# Patient Record
Sex: Male | Born: 1980 | Race: White | Hispanic: No | Smoking: Current every day smoker
Health system: Southern US, Community
[De-identification: ages and names within clinical notes are randomized; demographics above are authoritative.]

## PROBLEM LIST (undated history)

## (undated) DIAGNOSIS — F1910 Other psychoactive substance abuse, uncomplicated: Secondary | ICD-10-CM

## (undated) DIAGNOSIS — F410 Panic disorder [episodic paroxysmal anxiety] without agoraphobia: Secondary | ICD-10-CM

---
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
## 2002-06-15 ENCOUNTER — Emergency Department (HOSPITAL_COMMUNITY): Admission: EM | Admit: 2002-06-15 | Discharge: 2002-06-15 | Payer: Self-pay | Admitting: *Deleted

## 2002-10-10 ENCOUNTER — Emergency Department (HOSPITAL_COMMUNITY): Admission: EM | Admit: 2002-10-10 | Discharge: 2002-10-10 | Payer: Self-pay | Admitting: Emergency Medicine

## 2008-02-28 ENCOUNTER — Emergency Department (HOSPITAL_COMMUNITY): Admission: EM | Admit: 2008-02-28 | Discharge: 2008-02-28 | Payer: Self-pay | Admitting: *Deleted

## 2011-08-04 ENCOUNTER — Emergency Department (HOSPITAL_COMMUNITY): Payer: No Typology Code available for payment source

## 2011-08-04 ENCOUNTER — Encounter (HOSPITAL_COMMUNITY): Payer: Self-pay

## 2011-08-04 ENCOUNTER — Emergency Department (HOSPITAL_COMMUNITY)
Admission: EM | Admit: 2011-08-04 | Discharge: 2011-08-04 | Disposition: A | Discharge status: Home / Self Care | Payer: No Typology Code available for payment source | Attending: Emergency Medicine | Admitting: Emergency Medicine

## 2011-08-04 DIAGNOSIS — F17200 Nicotine dependence, unspecified, uncomplicated: Secondary | ICD-10-CM | POA: Insufficient documentation

## 2011-08-04 DIAGNOSIS — M5380 Other specified dorsopathies, site unspecified: Secondary | ICD-10-CM | POA: Insufficient documentation

## 2011-08-04 DIAGNOSIS — M545 Low back pain, unspecified: Secondary | ICD-10-CM | POA: Insufficient documentation

## 2011-08-04 DIAGNOSIS — M542 Cervicalgia: Secondary | ICD-10-CM | POA: Insufficient documentation

## 2011-08-04 MED ORDER — HYDROCODONE-ACETAMINOPHEN 5-325 MG PO TABS
1.0000 | ORAL_TABLET | Freq: Once | ORAL | Status: AC
  Administered 2011-08-04: 1 via ORAL
  Filled 2011-08-04: qty 1

## 2011-08-04 MED ORDER — IBUPROFEN 800 MG PO TABS: 800.0000 mg | ORAL_TABLET | Freq: Three times a day (TID) | ORAL | Status: AC

## 2011-08-04 MED ORDER — DIAZEPAM 5 MG PO TABS: 5.0000 mg | ORAL_TABLET | Freq: Two times a day (BID) | ORAL | Status: AC
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
## 2011-08-05 NOTE — ED Provider Notes (Signed)
Medical screening examination/treatment/procedure(s) were performed by non-physician practitioner and as supervising physician I was immediately available for consultation/collaboration.   Shiasia Porro, MD 08/05/11 2255 

## 2012-09-09 ENCOUNTER — Encounter (HOSPITAL_COMMUNITY): Payer: Self-pay | Admitting: Emergency Medicine

## 2012-09-09 ENCOUNTER — Emergency Department (HOSPITAL_COMMUNITY)
Admission: EM | Admit: 2012-09-09 | Discharge: 2012-09-09 | Disposition: A | Discharge status: Home / Self Care | Payer: Self-pay | Attending: Emergency Medicine | Admitting: Emergency Medicine

## 2012-09-09 DIAGNOSIS — L03119 Cellulitis of unspecified part of limb: Secondary | ICD-10-CM | POA: Insufficient documentation

## 2012-09-09 DIAGNOSIS — L02419 Cutaneous abscess of limb, unspecified: Secondary | ICD-10-CM

## 2012-09-09 DIAGNOSIS — F17200 Nicotine dependence, unspecified, uncomplicated: Secondary | ICD-10-CM | POA: Insufficient documentation

## 2012-09-09 DIAGNOSIS — L03116 Cellulitis of left lower limb: Secondary | ICD-10-CM

## 2012-09-09 MED ORDER — HYDROCODONE-ACETAMINOPHEN 5-325 MG PO TABS: 1.0000 | ORAL_TABLET | Freq: Four times a day (QID) | ORAL | Status: DC | PRN

## 2012-09-09 MED ORDER — SULFAMETHOXAZOLE-TRIMETHOPRIM 800-160 MG PO TABS: 1.0000 | ORAL_TABLET | Freq: Two times a day (BID) | ORAL | Status: DC

## 2012-09-09 MED ORDER — LIDOCAINE-EPINEPHRINE 2 %-1:100000 IJ SOLN
10.0000 mL | Freq: Once | INTRAMUSCULAR | Status: AC
  Administered 2012-09-09: 10 mL via INTRADERMAL

## 2012-09-09 MED ORDER — IBUPROFEN 600 MG PO TABS: 600.0000 mg | ORAL_TABLET | Freq: Four times a day (QID) | ORAL | Status: DC | PRN

## 2012-09-09 MED ORDER — CEPHALEXIN 500 MG PO CAPS: 500.0000 mg | ORAL_CAPSULE | Freq: Two times a day (BID) | ORAL | Status: DC

## 2012-09-09 NOTE — ED Provider Notes (Signed)
History     CSN: [id number]  Arrival date & time 09/09/12  1114   First MD Initiated Contact with Patient 09/09/12 1150      Chief Complaint  Patient presents with  . Abscess    2 day hx of increased red, swollen raised area on anterior lower leg.  . Wound Infection    Cellulitus - 2 inch diameter, redness around abscess    (Consider location/radiation/quality/duration/timing/severity/associated sxs/prior treatment) HPI Aldan Camey Bosque is a 32 y.o. male who presents with complaint of swelling and redness to the left lower leg. No drainage No injuries to the leg. States started out as a pimple and now swollen, redness, tenderness. Denies fever, chills, malaise. No treatment tried. No other complaints. No hx of the same.    History reviewed. No pertinent past medical history.  History reviewed. No pertinent past surgical history.  Family History  Problem Relation Age of Onset  . Cancer Other     History  Substance Use Topics  . Smoking status: Current Every Day Smoker    Types: Cigarettes  . Smokeless tobacco: Not on file  . Alcohol Use: No      Review of Systems  Constitutional: Negative for fever and chills.  Skin: Positive for color change and wound.    Allergies  Review of patient's allergies indicates no known allergies.  Home Medications  No current outpatient prescriptions on file.  BP 140/78  Pulse 76  Temp(Src) 98.1 F (36.7 C) (Oral)  Resp 18  SpO2 100%  Physical Exam  Nursing note and vitals reviewed. Constitutional: He appears well-developed and well-nourished. No distress.  Eyes: Conjunctivae are normal.  Cardiovascular: Normal rate, regular rhythm and normal heart sounds.   Pulmonary/Chest: Effort normal and breath sounds normal. No respiratory distress. He has no wheezes. He has no rales.  Skin: Skin is warm and dry.  3x3cm erythematous, indurated abscess with fluctuance.no drainage. There is about 5cm diameter surrounding erythema,  tenderness. No streaking. No compartment syndrome. Dorsal pedal pulses normal.     ED Course  Procedures (including critical care time)  INCISION AND DRAINAGE Performed by: Jaynie Crumble A Consent: Verbal consent obtained. Risks and benefits: risks, benefits and alternatives were discussed Type: abscess  Body area: left anterior mid shin  Anesthesia: local infiltration  Incision was made with a scalpel.  Local anesthetic: lidocaine 2% w epinephrine  Anesthetic total: 2 ml  Complexity: complex Blunt dissection to break up loculations  Drainage: purulent  Drainage amount: moderate  Patient tolerance: Patient tolerated the procedure well with no immediate complications.     1. Abscess of lower leg   2. Cellulitis of left lower leg       MDM  Pt with left anterior shin abscess and surrounding cellulitis. I&Ded. Pt tolerated it well. Will start on antibiotic. Follow up in 48hrs. Pt otherwise non toxic. Afebrile.   Filed Vitals:   09/09/12 1122 09/09/12 1306  BP: 140/78 129/88  Pulse: 76   Temp: 98.1 F (36.7 C)   TempSrc: Oral   Resp: 18   SpO2: 100%            Arrie Borrelli A Minervia Osso, PA-C 09/09/12 1555

## 2012-09-09 NOTE — ED Notes (Signed)
Pt reports increased pain and tenderness around red raised area on l/lower leg. "possible insect bite"

## 2012-09-11 NOTE — ED Provider Notes (Signed)
Medical screening examination/treatment/procedure(s) were performed by non-physician practitioner and as supervising physician I was immediately available for consultation/collaboration.   Laray Anger, DO 09/11/12 1919
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
## 2013-01-20 ENCOUNTER — Emergency Department (HOSPITAL_COMMUNITY)
Admission: EM | Admit: 2013-01-20 | Discharge: 2013-01-20 | Disposition: A | Discharge status: Home / Self Care | Payer: Self-pay | Attending: Emergency Medicine | Admitting: Emergency Medicine

## 2013-01-20 ENCOUNTER — Encounter (HOSPITAL_COMMUNITY): Payer: Self-pay | Admitting: Emergency Medicine

## 2013-01-20 DIAGNOSIS — F17200 Nicotine dependence, unspecified, uncomplicated: Secondary | ICD-10-CM | POA: Insufficient documentation

## 2013-01-20 DIAGNOSIS — L02219 Cutaneous abscess of trunk, unspecified: Secondary | ICD-10-CM | POA: Insufficient documentation

## 2013-01-20 DIAGNOSIS — L0291 Cutaneous abscess, unspecified: Secondary | ICD-10-CM

## 2013-01-20 MED ORDER — HYDROCODONE-ACETAMINOPHEN 5-325 MG PO TABS: ORAL_TABLET | ORAL | Status: DC

## 2013-01-20 MED ORDER — SULFAMETHOXAZOLE-TMP DS 800-160 MG PO TABS
1.0000 | ORAL_TABLET | Freq: Once | ORAL | Status: AC
  Administered 2013-01-20: 1 via ORAL
  Filled 2013-01-20: qty 1

## 2013-01-20 MED ORDER — SULFAMETHOXAZOLE-TRIMETHOPRIM 800-160 MG PO TABS: 1.0000 | ORAL_TABLET | Freq: Two times a day (BID) | ORAL | Status: DC

## 2013-01-20 MED ORDER — HYDROCODONE-ACETAMINOPHEN 5-325 MG PO TABS
2.0000 | ORAL_TABLET | Freq: Once | ORAL | Status: AC
  Administered 2013-01-20: 2 via ORAL
  Filled 2013-01-20: qty 2

## 2013-01-20 NOTE — ED Provider Notes (Signed)
CSN: [id number]     Arrival date & time 01/20/13  1354 History     First MD Initiated Contact with Patient 01/20/13 1519     Chief Complaint  Patient presents with  . Recurrent Skin Infections    left chest   (Consider location/radiation/quality/duration/timing/severity/associated sxs/prior Treatment) HPI  Joshua Bishop is a 32 y.o. male complaining of abscess to left chest worsening over the course of 5 days. Patient has prior history of single abscess to the left leg. Patient tried opening suggest abscess with no success. He denies fever, nausea vomiting states is painful, described as pressure-like 8/10, exacerbated by palpation  History reviewed. No pertinent past medical history. History reviewed. No pertinent past surgical history. Family History  Problem Relation Age of Onset  . Cancer Other    History  Substance Use Topics  . Smoking status: Current Every Day Smoker    Types: Cigarettes  . Smokeless tobacco: Not on file  . Alcohol Use: No    Review of Systems  10 systems reviewed and found to be negative, except as noted in the HPI   Allergies  Review of patient's allergies indicates no known allergies.  Home Medications   Current Outpatient Rx  Name  Route  Sig  Dispense  Refill  . acetaminophen (TYLENOL) 500 MG tablet   Oral   Take 1,000 mg by mouth every 6 (six) hours as needed for pain.          BP 132/82  Pulse 72  Temp(Src) 98.4 F (36.9 C) (Oral)  Resp 18  SpO2 96% Physical Exam  Nursing note and vitals reviewed. Constitutional: He is oriented to person, place, and time. He appears well-developed and well-nourished. No distress.  HENT:  Head: Normocephalic.  Mouth/Throat: Oropharynx is clear and moist.  Eyes: Conjunctivae and EOM are normal. Pupils are equal, round, and reactive to light.  Cardiovascular: Normal rate.   Pulmonary/Chest: Effort normal and breath sounds normal. No stridor.    Abdominal: Soft. Bowel sounds are normal.    Musculoskeletal: Normal range of motion.  Neurological: He is alert and oriented to person, place, and time.  Skin:  Fluctuant abscess to left chest surrounded by a 8 cm area of cellulitis  Psychiatric: He has a normal mood and affect.    ED Course   Procedures (including critical care time) INCISION AND DRAINAGE Performed by: Wynetta Emery Consent: Verbal consent obtained. Risks and benefits: risks, benefits and alternatives were discussed Type: abscess  Body area: Left chest  Anesthesia: local infiltration  Incision was made with a scalpel.  Local anesthetic: lidocaine 2% with epinephrine  Anesthetic total: 3 ml  Complexity: complex Blunt dissection to break up loculations  Drainage: purulent  Drainage amount: 2mL  Packing material: none  Patient tolerance: Patient tolerated the procedure well with no immediate complications.     Labs Reviewed - No data to display No results found. 1. Abscess and cellulitis     MDM   Filed Vitals:   01/20/13 1428  BP: 132/82  Pulse: 72  Temp: 98.4 F (36.9 C)  TempSrc: Oral  Resp: 18  SpO2: 96%     Joshua Bishop is a 32 y.o. male with abscess and cellulitis to left chest, incision and drainage performed. No signs of cellulitis. Patient will be started on Bactrim. Return precautions discussed.  Medications  HYDROcodone-acetaminophen (NORCO/VICODIN) 5-325 MG per tablet 2 tablet (2 tablets Oral Given 01/20/13 1619)  sulfamethoxazole-trimethoprim (BACTRIM DS) 800-160 MG per tablet 1  tablet (1 tablet Oral Given 01/20/13 1619)    Pt is hemodynamically stable, appropriate for, and amenable to discharge at this time. Pt verbalized understanding and agrees with care plan. All questions answered. Outpatient follow-up and specific return precautions discussed.    Discharge Medication List as of 01/20/2013  4:14 PM    START taking these medications   Details  HYDROcodone-acetaminophen (NORCO/VICODIN) 5-325 MG per  tablet Take 1-2 tablets by mouth every 6 hours as needed for pain., Print    sulfamethoxazole-trimethoprim (SEPTRA DS) 800-160 MG per tablet Take 1 tablet by mouth every 12 (twelve) hours., Starting 01/20/2013, Until Discontinued, Print        Note: Portions of this report may have been transcribed using voice recognition software. Every effort was made to ensure accuracy; however, inadvertent computerized transcription errors may be present    Wynetta Emery, PA-C 01/22/13 [phone]

## 2013-01-20 NOTE — ED Notes (Signed)
Pt states that he has a boil on his left chest that has been there about five days.  Pt states that he tried popping it bc it was green.  Pt states that it's getting bigger and more sore.

## 2013-01-20 NOTE — Progress Notes (Signed)
[facility] provided patient with a [employer].

## 2013-01-30 NOTE — ED Provider Notes (Signed)
Medical screening examination/treatment/procedure(s) were performed by non-physician practitioner and as supervising physician I was immediately available for consultation/collaboration.   Loren Racer, MD 01/30/13 [phone]

## 2013-08-02 ENCOUNTER — Encounter (HOSPITAL_COMMUNITY): Payer: Self-pay | Admitting: Emergency Medicine

## 2013-08-02 ENCOUNTER — Emergency Department (HOSPITAL_COMMUNITY)
Admission: EM | Admit: 2013-08-02 | Discharge: 2013-08-02 | Disposition: A | Discharge status: Home / Self Care | Payer: Self-pay | Attending: Emergency Medicine | Admitting: Emergency Medicine

## 2013-08-02 ENCOUNTER — Emergency Department (HOSPITAL_COMMUNITY): Payer: Self-pay

## 2013-08-02 DIAGNOSIS — J3489 Other specified disorders of nose and nasal sinuses: Secondary | ICD-10-CM | POA: Insufficient documentation

## 2013-08-02 DIAGNOSIS — Y939 Activity, unspecified: Secondary | ICD-10-CM | POA: Insufficient documentation

## 2013-08-02 DIAGNOSIS — F17200 Nicotine dependence, unspecified, uncomplicated: Secondary | ICD-10-CM | POA: Insufficient documentation

## 2013-08-02 DIAGNOSIS — Y929 Unspecified place or not applicable: Secondary | ICD-10-CM | POA: Insufficient documentation

## 2013-08-02 DIAGNOSIS — W010XXA Fall on same level from slipping, tripping and stumbling without subsequent striking against object, initial encounter: Secondary | ICD-10-CM | POA: Insufficient documentation

## 2013-08-02 DIAGNOSIS — S93409A Sprain of unspecified ligament of unspecified ankle, initial encounter: Secondary | ICD-10-CM | POA: Insufficient documentation

## 2013-08-02 MED ORDER — NAPROXEN 500 MG PO TABS: 500.0000 mg | ORAL_TABLET | Freq: Two times a day (BID) | ORAL | Status: DC

## 2013-08-02 NOTE — ED Notes (Signed)
Pt states that he slipped off his steps and now his L foot is swollen and bruised. No hx of injury to this foot.

## 2013-08-02 NOTE — ED Notes (Signed)
Pt in XRAY 

## 2013-08-02 NOTE — ED Notes (Signed)
Ortho called 

## 2013-08-02 NOTE — Discharge Instructions (Signed)
Your caregiver has diagnosed you as suffering from an ankle sprain. Ankle sprain occurs when the ligaments that hold the ankle joint together are stretched or torn. It may take 4 to 6 weeks to heal. For Activity: Use crutches with non-weight bearing for the first few days. Then, you may walk on your ankle as the pain allows, or as instructed. Start gradually with weight bearing on the affected ankle. Once you can walk pain free, then try jogging. When you can run forwards, then you can try moving side-to-side. If you cannot walk without crutches in one week, you need a re-check. SEEK IMMEDIATE MEDICAL ATTENTION IF: your toes are numb or tingling, appear gray or blue, or you have severe pain (also elevate leg and loosen splint).   Ankle Sprain An ankle sprain is an injury to the strong, fibrous tissues (ligaments) that hold the bones of your ankle joint together.  CAUSES An ankle sprain is usually caused by a fall or by twisting your ankle. Ankle sprains most commonly occur when you step on the outer edge of your foot, and your ankle turns inward. People who participate in sports are more prone to these types of injuries.  SYMPTOMS   Pain in your ankle. The pain may be present at rest or only when you are trying to stand or walk.  Swelling.  Bruising. Bruising may develop immediately or within 1 to 2 days after your injury.  Difficulty standing or walking, particularly when turning corners or changing directions. DIAGNOSIS  Your caregiver will ask you details about your injury and perform a physical exam of your ankle to determine if you have an ankle sprain. During the physical exam, your caregiver will press on and apply pressure to specific areas of your foot and ankle. Your caregiver will try to move your ankle in certain ways. An X-ray exam may be done to be sure a bone was not broken or a ligament did not separate from one of the bones in your ankle (avulsion fracture).  TREATMENT  Certain  types of braces can help stabilize your ankle. Your caregiver can make a recommendation for this. Your caregiver may recommend the use of medicine for pain. If your sprain is severe, your caregiver may refer you to a surgeon who helps to restore function to parts of your skeletal system (orthopedist) or a physical therapist. HOME CARE INSTRUCTIONS   Apply ice to your injury for 1 2 days or as directed by your caregiver. Applying ice helps to reduce inflammation and pain.  Put ice in a plastic bag.  Place a towel between your skin and the bag.  Leave the ice on for 15-20 minutes at a time, every 2 hours while you are awake.  Only take over-the-counter or prescription medicines for pain, discomfort, or fever as directed by your caregiver.  Elevate your injured ankle above the level of your heart as much as possible for 2 3 days.  If your caregiver recommends crutches, use them as instructed. Gradually put weight on the affected ankle. Continue to use crutches or a cane until you can walk without feeling pain in your ankle.  If you have a plaster splint, wear the splint as directed by your caregiver. Do not rest it on anything harder than a pillow for the first 24 hours. Do not put weight on it. Do not get it wet. You may take it off to take a shower or bath.  You may have been given an elastic bandage  to wear around your ankle to provide support. If the elastic bandage is too tight (you have numbness or tingling in your foot or your foot becomes cold and blue), adjust the bandage to make it comfortable.  If you have an air splint, you may blow more air into it or let air out to make it more comfortable. You may take your splint off at night and before taking a shower or bath. Wiggle your toes in the splint several times per day to decrease swelling. SEEK MEDICAL CARE IF:   You have rapidly increasing bruising or swelling.  Your toes feel extremely cold or you lose feeling in your  foot.  Your pain is not relieved with medicine. SEEK IMMEDIATE MEDICAL CARE IF:  Your toes are numb or blue.  You have severe pain that is increasing. MAKE SURE YOU:   Understand these instructions.  Will watch your condition.  Will get help right away if you are not doing well or get worse. Document Released: 06/03/2005 Document Revised: 02/26/2012 Document Reviewed: 06/15/2011 [facility] Patient Information 2014 [place], [place].

## 2013-08-02 NOTE — ED Provider Notes (Signed)
CSN: [id number]     Arrival date & time 08/02/13  1424 History  This chart was scribed for non-physician practitioner, Katherina MiresAbigail Allyanna Appleman-PA, working with Rolland PorterMark James, MD by Smiley HousemanFallon Davis, ED Scribe. This patient was seen in room WTR5/WTR5 and the patient's care was started at 4:05 PM.    Chief Complaint  Patient presents with  . Foot Injury   The history is provided by the patient. No language interpreter was used.   HPI Comments: Joshua Bishop is a 33 y.o. male who presents to the Emergency Department complaining of a left foot injury that occurred last night when he slipped on his concrete steps and fell.  Pt states he felt a pop when he fell, but is unsure if he heard a pop.  He reports he is unsure how his foot landed.  He states he woke up this morning with pressure in his left foot and swelling.  Pt denies numbness and tingling in his toes.  Pt denies any past h/o of injury to his left foot.    History reviewed. No pertinent past medical history. History reviewed. No pertinent past surgical history. Family History  Problem Relation Age of Onset  . Cancer Other    History  Substance Use Topics  . Smoking status: Current Every Day Smoker    Types: Cigarettes  . Smokeless tobacco: Not on file  . Alcohol Use: No    Review of Systems  Constitutional: Negative for fever and chills.  HENT: Positive for rhinorrhea.   Respiratory: Negative for cough and shortness of breath.   Cardiovascular: Negative for chest pain and leg swelling.  Gastrointestinal: Negative for nausea, vomiting, abdominal pain and diarrhea.  Musculoskeletal: Negative for back pain, gait problem, neck pain and neck stiffness.       Left foot injury  Skin: Negative for color change and rash.  Neurological: Negative for syncope.  Psychiatric/Behavioral: Negative for behavioral problems and confusion.  All other systems reviewed and are negative.    Allergies  Review of patient's allergies indicates no known  allergies.  Home Medications   Current Outpatient Rx  Name  Route  Sig  Dispense  Refill  . acetaminophen (TYLENOL) 500 MG tablet   Oral   Take 1,000 mg by mouth every 6 (six) hours as needed for pain.         . naproxen (NAPROSYN) 500 MG tablet   Oral   Take 1 tablet (500 mg total) by mouth 2 (two) times daily.   30 tablet   0    Triage Vitals: BP 130/79  Pulse 69  Temp(Src) 98.1 F (36.7 C) (Oral)  Resp 18  Ht 5\' 9"  (1.753 m)  Wt 187 lb (84.823 kg)  BMI 27.60 kg/m2  SpO2 98%  Physical Exam  Nursing note and vitals reviewed. Constitutional: He is oriented to person, place, and time. He appears well-developed and well-nourished. No distress.  HENT:  Head: Normocephalic and atraumatic.  Eyes: Conjunctivae and EOM are normal. Right eye exhibits no discharge. Left eye exhibits no discharge.  Neck: Neck supple. No tracheal deviation present.  Cardiovascular: Normal rate.   Pulmonary/Chest: Effort normal. No respiratory distress.  Abdominal: Soft.  Musculoskeletal: Normal range of motion. He exhibits tenderness.  Good pulses.  Swelling across mid dorsum of left foot.  Swelling worse over the 4th and 5th metatarsal.    Neurological: He is alert and oriented to person, place, and time.  Skin: Skin is warm and dry. No rash noted.  Psychiatric: He has a normal mood and affect. His behavior is normal. Judgment and thought content normal.    ED Course  Procedures (including critical care time) DIAGNOSTIC STUDIES: Oxygen Saturation is 98% on RA, normal by my interpretation.    COORDINATION OF CARE: 4:14 PM-informed pt his x-ray showed no abnormalities.  Will order crutches and ASO ankle.  Will discharge with naprosyn.  Patient informed of current plan of treatment and evaluation and agrees with plan.    Labs Review Labs Reviewed - No data to display Imaging Review Dg Foot Complete Left  08/02/2013   CLINICAL DATA:  Fall, dorsal foot pain  EXAM: LEFT FOOT - COMPLETE 3+  VIEW  COMPARISON:  None.  FINDINGS: There is no evidence of fracture or dislocation. There is no evidence of arthropathy or other focal bone abnormality. Soft tissues are unremarkable.  IMPRESSION: No acute osseous finding.   Electronically Signed   By: Ruel Favors M.D.   On: 08/02/2013 16:03    EKG Interpretation   None       MDM   Final diagnoses:  Ankle sprain    Patient X-Ray negative for obvious fracture or dislocation. Pain managed in ED. Pt advised to follow up with orthopedics if symptoms persist for possibility of missed fracture diagnosis. Patient given brace while in ED, conservative therapy recommended and discussed. Patient will be dc home & is agreeable with above plan.  I personally performed the services described in this documentation, which was scribed in my presence. The recorded information has been reviewed and is accurate.        Arthor Captain, PA-C 08/03/13 [phone]

## 2013-08-06 NOTE — ED Provider Notes (Signed)
Medical screening examination/treatment/procedure(s) were performed by non-physician practitioner and as supervising physician I was immediately available for consultation/collaboration.  EKG Interpretation   None         Emogene Muratalla, MD 08/06/13 0822 

## 2017-10-19 ENCOUNTER — Emergency Department (HOSPITAL_COMMUNITY)
Admission: EM | Admit: 2017-10-19 | Discharge: 2017-10-19 | Disposition: A | Discharge status: Home / Self Care | Payer: Self-pay | Attending: Emergency Medicine | Admitting: Emergency Medicine

## 2017-10-19 ENCOUNTER — Other Ambulatory Visit: Payer: Self-pay

## 2017-10-19 ENCOUNTER — Emergency Department (HOSPITAL_COMMUNITY): Payer: Self-pay

## 2017-10-19 ENCOUNTER — Encounter (HOSPITAL_COMMUNITY): Payer: Self-pay | Admitting: Emergency Medicine

## 2017-10-19 DIAGNOSIS — F17210 Nicotine dependence, cigarettes, uncomplicated: Secondary | ICD-10-CM | POA: Insufficient documentation

## 2017-10-19 DIAGNOSIS — Z79899 Other long term (current) drug therapy: Secondary | ICD-10-CM | POA: Insufficient documentation

## 2017-10-19 DIAGNOSIS — J40 Bronchitis, not specified as acute or chronic: Secondary | ICD-10-CM | POA: Insufficient documentation

## 2017-10-19 LAB — URINALYSIS, ROUTINE W REFLEX MICROSCOPIC
Bilirubin Urine: NEGATIVE
GLUCOSE, UA: NEGATIVE mg/dL
Hgb urine dipstick: NEGATIVE
KETONES UR: NEGATIVE mg/dL
LEUKOCYTES UA: NEGATIVE
Nitrite: NEGATIVE
PH: 9 — AB (ref 5.0–8.0)
Protein, ur: NEGATIVE mg/dL
Specific Gravity, Urine: 1.009 (ref 1.005–1.030)

## 2017-10-19 LAB — COMPREHENSIVE METABOLIC PANEL
ALK PHOS: 52 U/L (ref 38–126)
ALT: 20 U/L (ref 17–63)
AST: 25 U/L (ref 15–41)
Albumin: 3.8 g/dL (ref 3.5–5.0)
Anion gap: 6 (ref 5–15)
BILIRUBIN TOTAL: 1.3 mg/dL — AB (ref 0.3–1.2)
BUN: 11 mg/dL (ref 6–20)
CALCIUM: 9.4 mg/dL (ref 8.9–10.3)
CHLORIDE: 106 mmol/L (ref 101–111)
CO2: 24 mmol/L (ref 22–32)
Creatinine, Ser: 0.93 mg/dL (ref 0.61–1.24)
GFR calc non Af Amer: >60 mL/min (ref >60)
Glucose, Bld: 130 mg/dL — ABNORMAL HIGH (ref 65–99)
Potassium: 3.8 mmol/L (ref 3.5–5.1)
Sodium: 136 mmol/L (ref 135–145)
Total Protein: 6.9 g/dL (ref 6.5–8.1)

## 2017-10-19 LAB — CBC
HEMATOCRIT: 40.9 % (ref 39.0–52.0)
Hemoglobin: 13.6 g/dL (ref 13.0–17.0)
MCH: 30.2 pg (ref 26.0–34.0)
MCHC: 33.3 g/dL (ref 30.0–36.0)
MCV: 90.7 fL (ref 78.0–100.0)
Platelets: 213 10*3/uL (ref 150–400)
RBC: 4.51 MIL/uL (ref 4.22–5.81)
RDW: 13.3 % (ref 11.5–15.5)
WBC: 9.5 10*3/uL (ref 4.0–10.5)

## 2017-10-19 LAB — LIPASE, BLOOD: LIPASE: 24 U/L (ref 11–51)

## 2017-10-19 MED ORDER — AMOXICILLIN 500 MG PO CAPS: 500.0000 mg | ORAL_CAPSULE | Freq: Three times a day (TID) | ORAL | 0 refills | Status: DC

## 2017-10-19 NOTE — ED Provider Notes (Signed)
[facility] EMERGENCY DEPARTMENT Provider Note   CSN: [id number] Arrival date & time: 10/19/17  1841     History   Chief Complaint Chief Complaint  Patient presents with  . Abdominal Pain    HPI Joshua Bishop is a 37 y.o. male.  Patient complains of cough congestion some abdominal cramping and aches.  The history is provided by the patient. No language interpreter was used.  Illness  This is a new problem. The current episode started 2 days ago. The problem occurs constantly. Pertinent negatives include no chest pain, no abdominal pain and no headaches. Nothing aggravates the symptoms. Nothing relieves the symptoms. He has tried nothing for the symptoms. The treatment provided no relief.    History reviewed. No pertinent past medical history.  There are no active problems to display for this patient.   History reviewed. No pertinent surgical history.      Home Medications    Prior to Admission medications   Medication Sig Start Date End Date Taking? Authorizing Provider  ibuprofen (ADVIL,MOTRIN) 200 MG tablet Take 400 mg by mouth every 6 (six) hours as needed.   Yes [provider]  acetaminophen (TYLENOL) 500 MG tablet Take 1,000 mg by mouth every 6 (six) hours as needed for pain.    [provider]  amoxicillin (AMOXIL) 500 MG capsule Take 1 capsule (500 mg total) by mouth 3 (three) times daily. 10/19/17   Bethann Berkshire, MD    Family History Family History  Problem Relation Age of Onset  . Cancer Other     Social History Social History   Tobacco Use  . Smoking status: Current Every Day Smoker    Packs/day: 0.06    Types: Cigarettes  . Smokeless tobacco: Never Used  Substance Use Topics  . Alcohol use: No  . Drug use: Never     Allergies   Patient has no known allergies.   Review of Systems Review of Systems  Constitutional: Negative for appetite change and fatigue.  HENT: Negative for congestion, ear discharge and sinus  pressure.   Eyes: Negative for discharge.  Respiratory: Positive for cough.   Cardiovascular: Negative for chest pain.  Gastrointestinal: Negative for abdominal pain and diarrhea.  Genitourinary: Negative for frequency and hematuria.  Musculoskeletal: Negative for back pain.  Skin: Negative for rash.  Neurological: Negative for seizures and headaches.  Psychiatric/Behavioral: Negative for hallucinations.     Physical Exam Updated Vital Signs BP 131/71 (BP Location: Right Arm)   Pulse 83   Temp 98.9 F (37.2 C) (Oral)   Resp 18   Ht  (1.753 m)   Wt 83.9 kg (185 lb)   SpO2 99%   BMI 27.32 kg/m   Physical Exam  Constitutional: He is oriented to person, place, and time. He appears well-developed.  HENT:  Head: Normocephalic.  Eyes: Conjunctivae and EOM are normal. No scleral icterus.  Neck: Neck supple. No thyromegaly present.  Cardiovascular: Normal rate and regular rhythm. Exam reveals no gallop and no friction rub.  No murmur heard. Pulmonary/Chest: No stridor. He has no wheezes. He has no rales. He exhibits no tenderness.  Abdominal: He exhibits no distension. There is no tenderness. There is no rebound.  Musculoskeletal: Normal range of motion. He exhibits no edema.  Lymphadenopathy:    He has no cervical adenopathy.  Neurological: He is oriented to person, place, and time. He exhibits normal muscle tone. Coordination normal.  Skin: No rash noted. No erythema.  Psychiatric: He has  a normal mood and affect. His behavior is normal.     ED Treatments / Results  Labs (all labs ordered are listed, but only abnormal results are displayed) Labs Reviewed  COMPREHENSIVE METABOLIC PANEL - Abnormal; Notable for the following components:      Result Value   Glucose, Bld 130 (*)    Total Bilirubin 1.3 (*)    All other components within normal limits  URINALYSIS, ROUTINE W REFLEX MICROSCOPIC - Abnormal; Notable for the following components:   pH 9.0 (*)    All other  components within normal limits  LIPASE, BLOOD  CBC    EKG None  Radiology Dg Chest 2 View  Result Date: 10/19/2017 CLINICAL DATA:  Cough, chest pain EXAM: CHEST - 2 VIEW COMPARISON:  None. FINDINGS: Normal cardiac silhouette. There bronchitic markings centrally. No focal infiltrate. No pneumothorax. No acute osseous abnormality. IMPRESSION: Bronchitic markings centrally suggests bronchiolitis or bronchitis. Electronically Signed   By: Genevive Bi M.D.   On: 10/19/2017 20:50    Procedures Procedures (including critical care time)  Medications Ordered in ED Medications - No data to display   Initial Impression / Assessment and Plan / ED Course  I have reviewed the triage vital signs and the nursing notes.  Pertinent labs & imaging results that were available during my care of the patient were reviewed by me and considered in my medical decision making (see chart for details).     Patient with bronchitis.  Patient will take Tylenol or Motrin,  is given some amoxicillin will drink plenty of fluids and follow-up if not improving  Final Clinical Impressions(s) / ED Diagnoses   Final diagnoses:  Bronchitis    ED Discharge Orders        Ordered    amoxicillin (AMOXIL) 500 MG capsule  3 times daily     10/19/17 2111       Bethann Berkshire, MD 10/19/17 2116

## 2017-10-19 NOTE — Discharge Instructions (Addendum)
Drink plenty of fluids. Take Tylenol or Motrin for fevers and aches and follow-up if not improving °

## 2017-10-19 NOTE — ED Notes (Signed)
Pt returned from xray

## 2017-10-19 NOTE — ED Triage Notes (Addendum)
Patient has multiple generalized abd pain with nausea, vomiting, diarrhea, and fevers x2 days. Patient also states cough, sore throat, and sinus pressure/congestion. Per patient cough productive with thick black sputum. Patient took Muinex, delsym, and ibuprofen with no relief. Patient's girlfriend states decreased appetite and fatigue.

## 2019-01-10 ENCOUNTER — Other Ambulatory Visit: Payer: Self-pay

## 2019-01-10 ENCOUNTER — Emergency Department (HOSPITAL_COMMUNITY)
Admission: EM | Admit: 2019-01-10 | Discharge: 2019-01-10 | Disposition: A | Discharge status: Home / Self Care | Payer: Self-pay | Attending: Emergency Medicine | Admitting: Emergency Medicine

## 2019-01-10 ENCOUNTER — Encounter (HOSPITAL_COMMUNITY): Payer: Self-pay | Admitting: Emergency Medicine

## 2019-01-10 DIAGNOSIS — E876 Hypokalemia: Secondary | ICD-10-CM | POA: Insufficient documentation

## 2019-01-10 DIAGNOSIS — F410 Panic disorder [episodic paroxysmal anxiety] without agoraphobia: Secondary | ICD-10-CM | POA: Insufficient documentation

## 2019-01-10 DIAGNOSIS — F17210 Nicotine dependence, cigarettes, uncomplicated: Secondary | ICD-10-CM | POA: Insufficient documentation

## 2019-01-10 LAB — COMPREHENSIVE METABOLIC PANEL
ALT: 18 U/L (ref 0–44)
AST: 22 U/L (ref 15–41)
Albumin: 4.5 g/dL (ref 3.5–5.0)
Alkaline Phosphatase: 59 U/L (ref 38–126)
Anion gap: 10 (ref 5–15)
BUN: 13 mg/dL (ref 6–20)
CO2: 19 mmol/L — ABNORMAL LOW (ref 22–32)
Calcium: 9.2 mg/dL (ref 8.9–10.3)
Chloride: 106 mmol/L (ref 98–111)
Creatinine, Ser: 1.04 mg/dL (ref 0.61–1.24)
GFR calc Af Amer: >60 mL/min (ref >60)
GFR calc non Af Amer: >60 mL/min (ref >60)
Glucose, Bld: 90 mg/dL (ref 70–99)
Potassium: 2.9 mmol/L — ABNORMAL LOW (ref 3.5–5.1)
Sodium: 135 mmol/L (ref 135–145)
Total Bilirubin: 1.9 mg/dL — ABNORMAL HIGH (ref 0.3–1.2)
Total Protein: 7.2 g/dL (ref 6.5–8.1)

## 2019-01-10 MED ORDER — SODIUM CHLORIDE 0.9 % IV BOLUS (SEPSIS)
1000.0000 mL | Freq: Once | INTRAVENOUS | Status: AC
  Administered 2019-01-10: 1000 mL via INTRAVENOUS

## 2019-01-10 MED ORDER — POTASSIUM CHLORIDE CRYS ER 20 MEQ PO TBCR
40.0000 meq | EXTENDED_RELEASE_TABLET | Freq: Once | ORAL | Status: AC
  Administered 2019-01-10: 40 meq via ORAL
  Filled 2019-01-10: qty 2

## 2019-01-10 MED ORDER — LORAZEPAM 1 MG PO TABS
1.0000 mg | ORAL_TABLET | Freq: Once | ORAL | Status: AC
  Administered 2019-01-10: 1 mg via ORAL
  Filled 2019-01-10: qty 1

## 2019-01-10 MED ORDER — POTASSIUM CHLORIDE 10 MEQ/100ML IV SOLN
10.0000 meq | Freq: Once | INTRAVENOUS | Status: AC
  Administered 2019-01-10: 10 meq via INTRAVENOUS
  Filled 2019-01-10: qty 100

## 2019-01-10 MED ORDER — SODIUM CHLORIDE 0.9 % IV SOLN
1000.0000 mL | INTRAVENOUS | Status: DC
  Administered 2019-01-10: 1000 mL via INTRAVENOUS

## 2019-01-10 NOTE — Discharge Instructions (Signed)
Your potassium was low at 2.9.  You received both IV and oral potassium while being here in the emergency department.  I am including information on the potassium content of food.  Please increase high potassium foods in your diet.  Please see your doctor or call the clinic listed on your discharge instructions to have your potassium rechecked in the next 7 to 10 days.  You may also want to discuss the issues with panic attacks.

## 2019-01-10 NOTE — ED Provider Notes (Signed)
[facility] EMERGENCY DEPARTMENT Provider Note   CSN: [id number] Arrival date & time: 01/10/19  1828     History   Chief Complaint Chief Complaint  Patient presents with  . Panic Attack    HPI Joshua Bishop is a 38 y.o. male.     Patient is a 38 year old male who presents to the emergency department with a complaint of panic attacks.  The patient states that in the last couple of hours he had to panic attacks just prior to his arrival to the emergency department.  He is also noticed that over the past couple of weeks he is having an increased number of panic attacks.  The patient would not identify stressors at this time.  He denies suicidal homicidal ideations.  He is not on any medication for his stress or panic situation.  He denies the use of any drugs or alcohol.  He presents now for evaluation because he says he is having cramping of his fingers, discomfort about his face, and sensation that he may be having a more serious problem than just the panic.     History reviewed. No pertinent past medical history.  There are no active problems to display for this patient.   History reviewed. No pertinent surgical history.      Home Medications    Prior to Admission medications   Medication Sig Start Date End Date Taking? Authorizing Provider  acetaminophen (TYLENOL) 500 MG tablet Take 1,000 mg by mouth every 6 (six) hours as needed for pain.    [provider]  amoxicillin (AMOXIL) 500 MG capsule Take 1 capsule (500 mg total) by mouth 3 (three) times daily. 10/19/17   Milton Ferguson, MD  ibuprofen (ADVIL,MOTRIN) 200 MG tablet Take 400 mg by mouth every 6 (six) hours as needed.    [provider]    Family History Family History  Problem Relation Age of Onset  . Cancer Other     Social History Social History   Tobacco Use  . Smoking status: Current Every Day Smoker    Packs/day: 0.06    Types: Cigarettes  . Smokeless tobacco: Never Used   Substance Use Topics  . Alcohol use: No  . Drug use: Never     Allergies   Patient has no known allergies.   Review of Systems Review of Systems  Constitutional: Negative for activity change and appetite change.  HENT: Negative for congestion, ear discharge, ear pain, facial swelling, nosebleeds, rhinorrhea, sneezing and tinnitus.   Eyes: Negative for photophobia, pain and discharge.  Respiratory: Negative for cough, choking, shortness of breath and wheezing.   Cardiovascular: Negative for chest pain, palpitations and leg swelling.  Gastrointestinal: Negative for abdominal pain, blood in stool, constipation, diarrhea, nausea and vomiting.  Genitourinary: Negative for difficulty urinating, dysuria, flank pain, frequency and hematuria.  Musculoskeletal: Positive for myalgias. Negative for back pain, gait problem and neck pain.  Skin: Negative for color change, rash and wound.  Neurological: Negative for dizziness, seizures, syncope, facial asymmetry, speech difficulty, weakness and numbness.  Hematological: Negative for adenopathy. Does not bruise/bleed easily.  Psychiatric/Behavioral: Negative for agitation, confusion, hallucinations, self-injury and suicidal ideas. The patient is nervous/anxious.      Physical Exam Updated Vital Signs BP (!) 130/110 (BP Location: Right Arm)   Pulse (!) 110   Temp 98.5 F (36.9 C) (Oral)   Resp (!) 22   Ht 5\' 8"  (1.727 m)   Wt 83.9 kg   SpO2 100%  BMI 28.13 kg/m   Physical Exam Vitals signs and nursing note reviewed.  Constitutional:      Appearance: He is well-developed. He is not toxic-appearing.  HENT:     Head: Normocephalic.     Right Ear: Tympanic membrane and external ear normal.     Left Ear: Tympanic membrane and external ear normal.  Eyes:     General: Lids are normal.     Pupils: Pupils are equal, round, and reactive to light.  Neck:     Musculoskeletal: Normal range of motion and neck supple.     Vascular: No carotid  bruit.  Cardiovascular:     Rate and Rhythm: Regular rhythm. Tachycardia present.     Pulses: Normal pulses.     Heart sounds: Normal heart sounds.  Pulmonary:     Effort: Tachypnea present. No respiratory distress.     Breath sounds: Normal breath sounds.  Abdominal:     General: Bowel sounds are normal.     Palpations: Abdomen is soft.     Tenderness: There is no abdominal tenderness. There is no guarding.  Musculoskeletal: Normal range of motion.  Lymphadenopathy:     Head:     Right side of head: No submandibular adenopathy.     Left side of head: No submandibular adenopathy.     Cervical: No cervical adenopathy.  Skin:    General: Skin is warm and dry.  Neurological:     Mental Status: He is alert and oriented to person, place, and time.     Cranial Nerves: No cranial nerve deficit.     Sensory: No sensory deficit.  Psychiatric:        Attention and Perception: He does not perceive auditory or visual hallucinations.        Mood and Affect: Mood is anxious.        Speech: Speech normal.        Thought Content: Thought content does not include homicidal or suicidal ideation. Thought content does not include homicidal or suicidal plan.      ED Treatments / Results  Labs (all labs ordered are listed, but only abnormal results are displayed) Labs Reviewed - No data to display  EKG None  Radiology No results found.  Procedures Procedures (including critical care time)  Medications Ordered in ED Medications - No data to display   Initial Impression / Assessment and Plan / ED Course  I have reviewed the triage vital signs and the nursing notes.  Pertinent labs & imaging results that were available during my care of the patient were reviewed by me and considered in my medical decision making (see chart for details).         Final Clinical Impressions(s) / ED Diagnoses MDM  Vital signs reviewed, pulse rate is elevated, respiratory rate is elevated, blood  pressure is elevated.  Patient is anxious.  Pulse oximetry is 100% on room air, within normal limits by my interpretation. Oral Ativan given.  Patient placed on the monitor.  Pulse oximetry and rhythm strip were monitored closely.  A comprehensive metabolic panel was obtained.  The potassium was noted to be low at 2.9, the CO2 was slightly low at 19.  Patient was given IV fluids and he was also given oral and IV potassium.  The total bilirubin was slightly elevated at 1.9, the remainder of the comprehensive metabolic panel was well within normal limits. After IV fluids, potassium, and Ativan, patient states he feels much better.  I have  asked the patient to increase his fluids.  To add Gatorade in his fluid arsenal.  Have also given him information on foods high in potassium.  And have asked him to follow-up with his primary physician to have his potassium rechecked in the office as an outpatient.  The patient is in agreement with this plan.  I have asked the patient to discuss his panic attacks with his primary physician.  The patient is to return to the emergency department if symptoms worsen, if there are any suicidal homicidal ideations, problems, or concerns.   Final diagnoses:  Panic attack  Hypokalemia    ED Discharge Orders    None       Ivery QualeBryant, Zayvien Canning, PA-C 01/12/19 1002    Terrilee FilesButler, Michael C, MD 01/12/19 [phone]

## 2019-01-10 NOTE — ED Triage Notes (Signed)
Pt states that he has had two panic attacks in the last hours.

## 2019-02-07 ENCOUNTER — Emergency Department (HOSPITAL_COMMUNITY)
Admission: EM | Admit: 2019-02-07 | Discharge: 2019-02-07 | Disposition: A | Discharge status: Home / Self Care | Payer: Self-pay | Attending: Emergency Medicine | Admitting: Emergency Medicine

## 2019-02-07 ENCOUNTER — Emergency Department (HOSPITAL_COMMUNITY): Payer: Self-pay

## 2019-02-07 ENCOUNTER — Other Ambulatory Visit: Payer: Self-pay

## 2019-02-07 DIAGNOSIS — F17210 Nicotine dependence, cigarettes, uncomplicated: Secondary | ICD-10-CM | POA: Insufficient documentation

## 2019-02-07 DIAGNOSIS — F410 Panic disorder [episodic paroxysmal anxiety] without agoraphobia: Secondary | ICD-10-CM | POA: Insufficient documentation

## 2019-02-07 LAB — CBC WITH DIFFERENTIAL/PLATELET
Abs Immature Granulocytes: 0.02 10*3/uL (ref 0.00–0.07)
Basophils Absolute: 0.1 10*3/uL (ref 0.0–0.1)
Basophils Relative: 1 %
Eosinophils Absolute: 0.3 10*3/uL (ref 0.0–0.5)
Eosinophils Relative: 4 %
HCT: 44.4 % (ref 39.0–52.0)
Hemoglobin: 15 g/dL (ref 13.0–17.0)
Immature Granulocytes: 0 %
Lymphocytes Relative: 34 %
Lymphs Abs: 2.2 10*3/uL (ref 0.7–4.0)
MCH: 30.2 pg (ref 26.0–34.0)
MCHC: 33.8 g/dL (ref 30.0–36.0)
MCV: 89.3 fL (ref 80.0–100.0)
Monocytes Absolute: 0.7 10*3/uL (ref 0.1–1.0)
Monocytes Relative: 10 %
Neutro Abs: 3.3 10*3/uL (ref 1.7–7.7)
Neutrophils Relative %: 51 %
Platelets: 255 10*3/uL (ref 150–400)
RBC: 4.97 MIL/uL (ref 4.22–5.81)
RDW: 13.2 % (ref 11.5–15.5)
WBC: 6.6 10*3/uL (ref 4.0–10.5)
nRBC: 0 % (ref 0.0–0.2)

## 2019-02-07 LAB — MAGNESIUM: Magnesium: 1.9 mg/dL (ref 1.7–2.4)

## 2019-02-07 LAB — BASIC METABOLIC PANEL
Anion gap: 9 (ref 5–15)
BUN: 8 mg/dL (ref 6–20)
CO2: 20 mmol/L — ABNORMAL LOW (ref 22–32)
Calcium: 9.1 mg/dL (ref 8.9–10.3)
Chloride: 108 mmol/L (ref 98–111)
Creatinine, Ser: 0.96 mg/dL (ref 0.61–1.24)
GFR calc Af Amer: >60 mL/min (ref >60)
GFR calc non Af Amer: >60 mL/min (ref >60)
Glucose, Bld: 101 mg/dL — ABNORMAL HIGH (ref 70–99)
Potassium: 3.9 mmol/L (ref 3.5–5.1)
Sodium: 137 mmol/L (ref 135–145)

## 2019-02-07 MED ORDER — LORAZEPAM 0.5 MG PO TABS: 0.5000 mg | ORAL_TABLET | Freq: Three times a day (TID) | ORAL | 0 refills | Status: DC | PRN

## 2019-02-07 MED ORDER — SODIUM CHLORIDE 0.9 % IV BOLUS
1000.0000 mL | Freq: Once | INTRAVENOUS | Status: AC
  Administered 2019-02-07: 13:00:00 1000 mL via INTRAVENOUS

## 2019-02-07 MED ORDER — LORAZEPAM 2 MG/ML IJ SOLN
0.5000 mg | Freq: Once | INTRAMUSCULAR | Status: AC
  Administered 2019-02-07: 13:00:00 0.5 mg via INTRAVENOUS
  Filled 2019-02-07: qty 1

## 2019-02-07 NOTE — Discharge Instructions (Addendum)
Your work-up is reassuring today.  I suspect you are having panic attacks.  It is important that you follow-up with 1 of the counselors or psychiatrists as prescribed below or someone else that you would like to follow-up with in the community.  Take 1/2 tablet or 1 tablet Ativan every 8 hours as needed for anxiety or panic attacks.  Please return to the emergency department if you develop any new or worsening symptoms.

## 2019-02-07 NOTE — ED Provider Notes (Signed)
[facility] Provider Note   CSN: [id number] Arrival date & time: 02/07/19  1252     History   Chief Complaint Chief Complaint  Patient presents with  . Abnormal Lab    HPI Joshua Bishop is a 38 y.o. male who is previously healthy who presents with shortness of breath and tingling in his hands.  He feels like he is having a panic attack.  He thought that it may be related to his potassium being low, as it was last visit.  Patient denies any chest pain, cough, fever.  He reports his neck felt tight and sore during this episode.  He felt like a could not move his hands.  He reports he has been having episodes like this for the past 6 weeks.  He reports he took 1 of his brothers Xanax (who also suffers from panic attacks) and his symptoms resolved.  Patient denies any SI, HI.     HPI  No past medical history on file.  There are no active problems to display for this patient.   No past surgical history on file.      Home Medications    Prior to Admission medications   Medication Sig Start Date End Date Taking? Authorizing Provider  acetaminophen (TYLENOL) 500 MG tablet Take 1,000 mg by mouth every 6 (six) hours as needed for pain.    [provider]  amoxicillin (AMOXIL) 500 MG capsule Take 1 capsule (500 mg total) by mouth 3 (three) times daily. 10/19/17   Milton Ferguson, MD  ibuprofen (ADVIL,MOTRIN) 200 MG tablet Take 400 mg by mouth every 6 (six) hours as needed.    [provider]  LORazepam (ATIVAN) 0.5 MG tablet Take 1 tablet (0.5 mg total) by mouth every 8 (eight) hours as needed for anxiety (or panic attacks). 02/07/19   Frederica Kuster, PA-C    Family History Family History  Problem Relation Age of Onset  . Cancer Other     Social History Social History   Tobacco Use  . Smoking status: Current Every Day Smoker    Packs/day: 0.06    Types: Cigarettes  . Smokeless tobacco: Never Used  Substance Use  Topics  . Alcohol use: No  . Drug use: Never     Allergies   Patient has no known allergies.   Review of Systems Review of Systems  Constitutional: Negative for chills and fever.  HENT: Negative for facial swelling and sore throat.   Respiratory: Positive for shortness of breath.   Cardiovascular: Negative for chest pain.  Gastrointestinal: Negative for abdominal pain, nausea and vomiting.  Genitourinary: Negative for dysuria.  Musculoskeletal: Positive for neck pain. Negative for back pain.  Skin: Negative for rash and wound.  Neurological: Positive for numbness (tingling in hands). Negative for headaches.  Psychiatric/Behavioral: The patient is not nervous/anxious.      Physical Exam Updated Vital Signs BP (!) 136/95   Pulse 70   Temp 98.2 F (36.8 C) (Oral)   Resp 18   SpO2 99%   Physical Exam Vitals signs and nursing note reviewed.  Constitutional:      General: He is not in acute distress.    Appearance: He is well-developed. He is not diaphoretic.  HENT:     Head: Normocephalic and atraumatic.     Mouth/Throat:     Pharynx: No oropharyngeal exudate.  Eyes:     General: No scleral icterus.       Right  eye: No discharge.        Left eye: No discharge.     Conjunctiva/sclera: Conjunctivae normal.     Pupils: Pupils are equal, round, and reactive to light.  Neck:     Musculoskeletal: Normal range of motion and neck supple.     Thyroid: No thyromegaly.  Cardiovascular:     Rate and Rhythm: Normal rate and regular rhythm.     Heart sounds: Normal heart sounds. No murmur. No friction rub. No gallop.   Pulmonary:     Effort: Pulmonary effort is normal. No respiratory distress.     Breath sounds: Normal breath sounds. No stridor. No wheezing or rales.  Abdominal:     General: Bowel sounds are normal. There is no distension.     Palpations: Abdomen is soft.     Tenderness: There is no abdominal tenderness. There is no guarding or rebound.  Lymphadenopathy:      Cervical: No cervical adenopathy.  Skin:    General: Skin is warm and dry.     Coloration: Skin is not pale.     Findings: No rash.  Neurological:     Mental Status: He is alert.     Coordination: Coordination normal.     Comments: CN 3-12 intact; normal sensation throughout; 5/5 strength in all 4 extremities; equal bilateral grip strength      ED Treatments / Results  Labs (all labs ordered are listed, but only abnormal results are displayed) Labs Reviewed  BASIC METABOLIC PANEL - Abnormal; Notable for the following components:      Result Value   CO2 20 (*)    Glucose, Bld 101 (*)    All other components within normal limits  CBC WITH DIFFERENTIAL/PLATELET  MAGNESIUM    EKG None  Radiology Dg Chest Portable 1 View  Result Date: 02/07/2019 CLINICAL DATA:  Shortness of breath. EXAM: PORTABLE CHEST 1 VIEW COMPARISON:  10/19/2017 FINDINGS: The heart size and mediastinal contours are within normal limits. There is no evidence of pulmonary edema, consolidation, pneumothorax, nodule or pleural fluid. The visualized skeletal structures are unremarkable. IMPRESSION: No active disease. Electronically Signed   By: Irish LackGlenn  Yamagata M.D.   On: 02/07/2019 13:43    Procedures Procedures (including critical care time)  Medications Ordered in ED Medications  sodium chloride 0.9 % bolus 1,000 mL (1,000 mLs Intravenous New Bag/Given 02/07/19 1324)  LORazepam (ATIVAN) injection 0.5 mg (0.5 mg Intravenous Given 02/07/19 1320)     Initial Impression / Assessment and Plan / ED Course  I have reviewed the triage vital signs and the nursing notes.  Pertinent labs & imaging results that were available during my care of the patient were reviewed by me and considered in my medical decision making (see chart for details).        Patient presenting with suspected panic attack, history of same.  Potassium is within normal limits today as well as magnesium.  CBC unremarkable.  Chest x-ray is  clear.  Patient is feeling much better after Ativan.  Will discharge home with very short course.  Follow-up to outpatient counseling or psychiatry.  Return precautions discussed.  Patient understands and agrees with plan.  Patient vital stable throughout ED course and discharged in satisfactory condition.  Patient also evaluated by my attending, Dr. Rosalia Hammersay, who guided the patient's management and agrees with plan  Final Clinical Impressions(s) / ED Diagnoses   Final diagnoses:  Panic attack    ED Discharge Orders  Ordered    LORazepam (ATIVAN) 0.5 MG tablet  Every 8 hours PRN     02/07/19 1447           Emi HolesLaw, Liat Mayol M, PA-C 02/07/19 1455    Margarita Grizzleay, Danielle, MD 02/07/19 [id number]

## 2019-02-07 NOTE — ED Triage Notes (Signed)
Pt states that about a couple of weeks ago he was told he had low k+ and was replaced ; pt c/o sob that ebgan 1 hr ago along with tingling all over his body and a sore neck ; pt has hx of panic attacks and states this feels the same

## 2019-04-05 IMAGING — DX DG CHEST 2V
2 series · 2 of 2 positions shown · non-contrast
Comparison: None.

CLINICAL DATA: Cough, chest pain

EXAM:
CHEST - 2 VIEW

[chest pa]
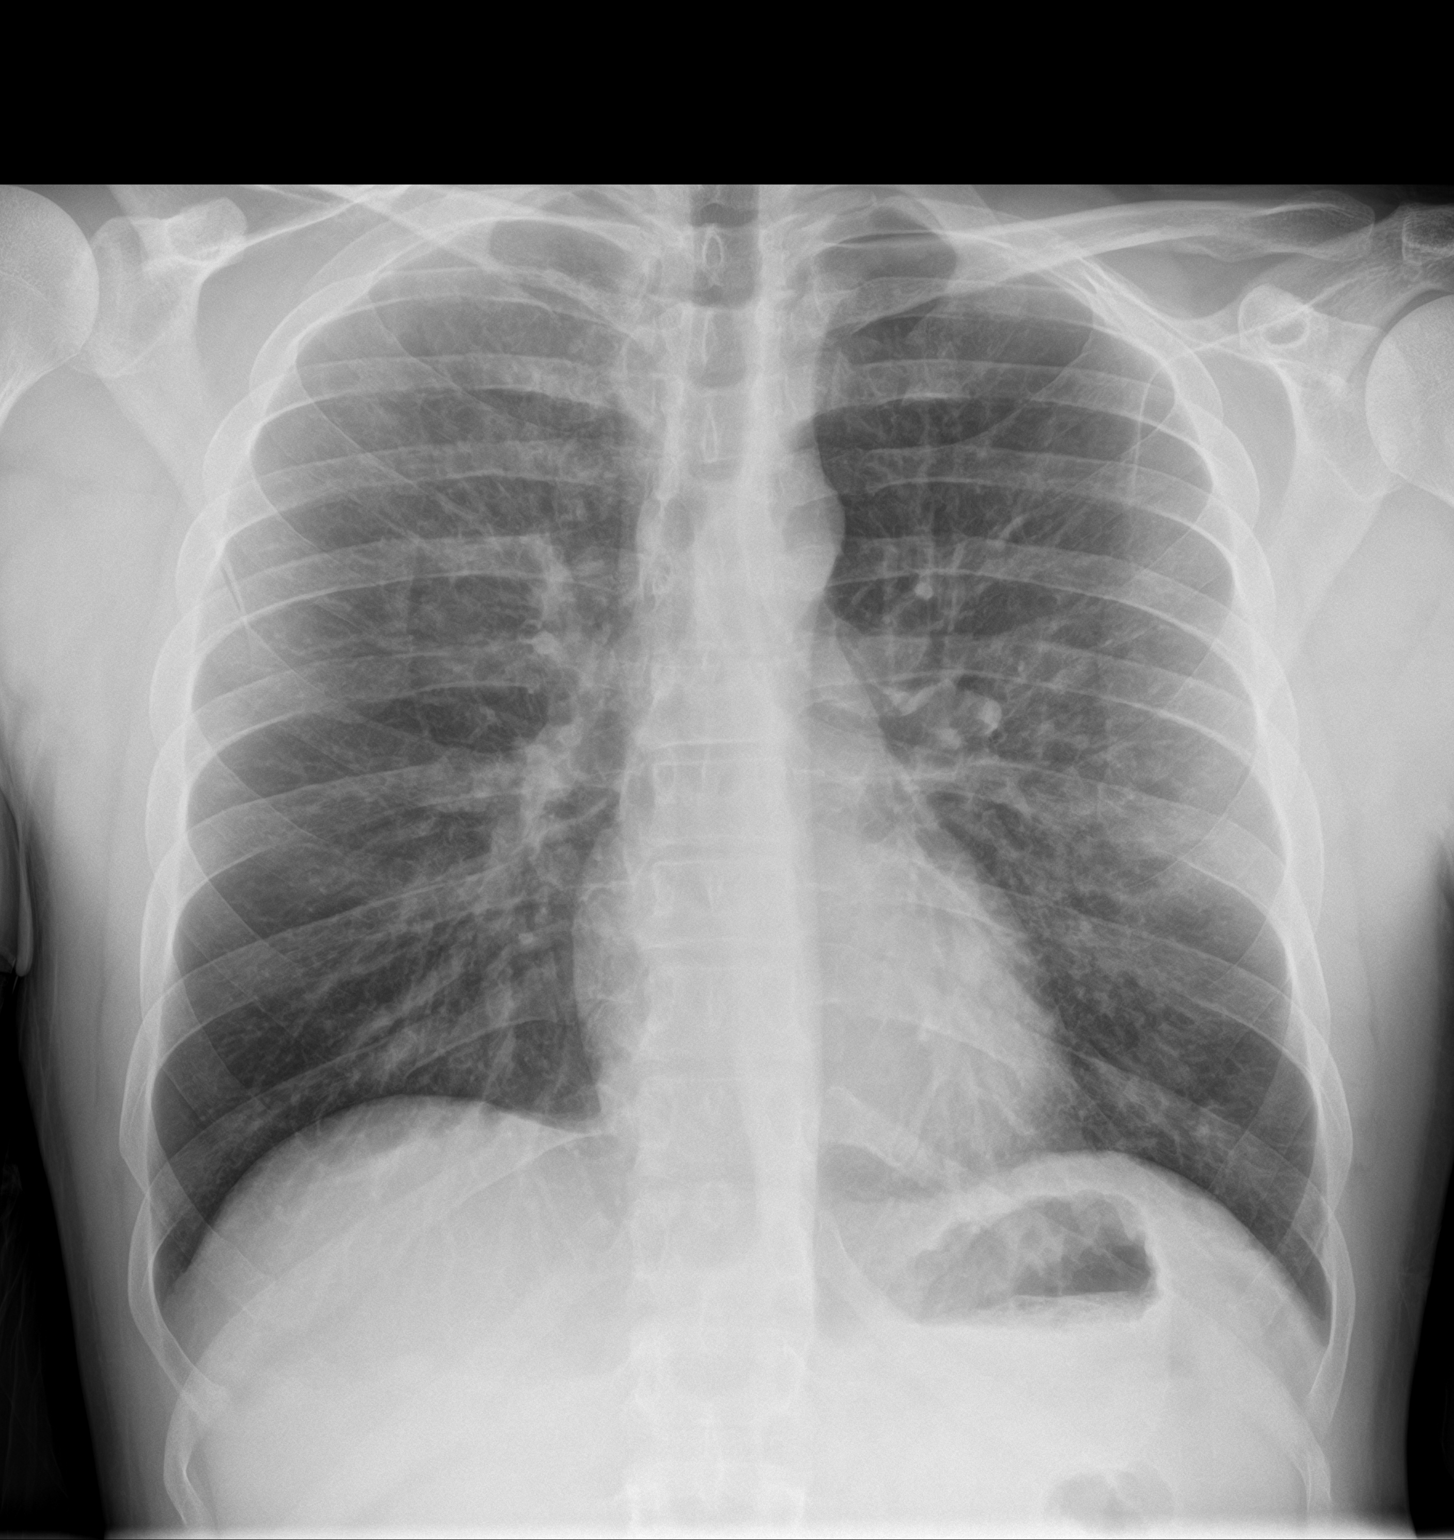

[chest lat]
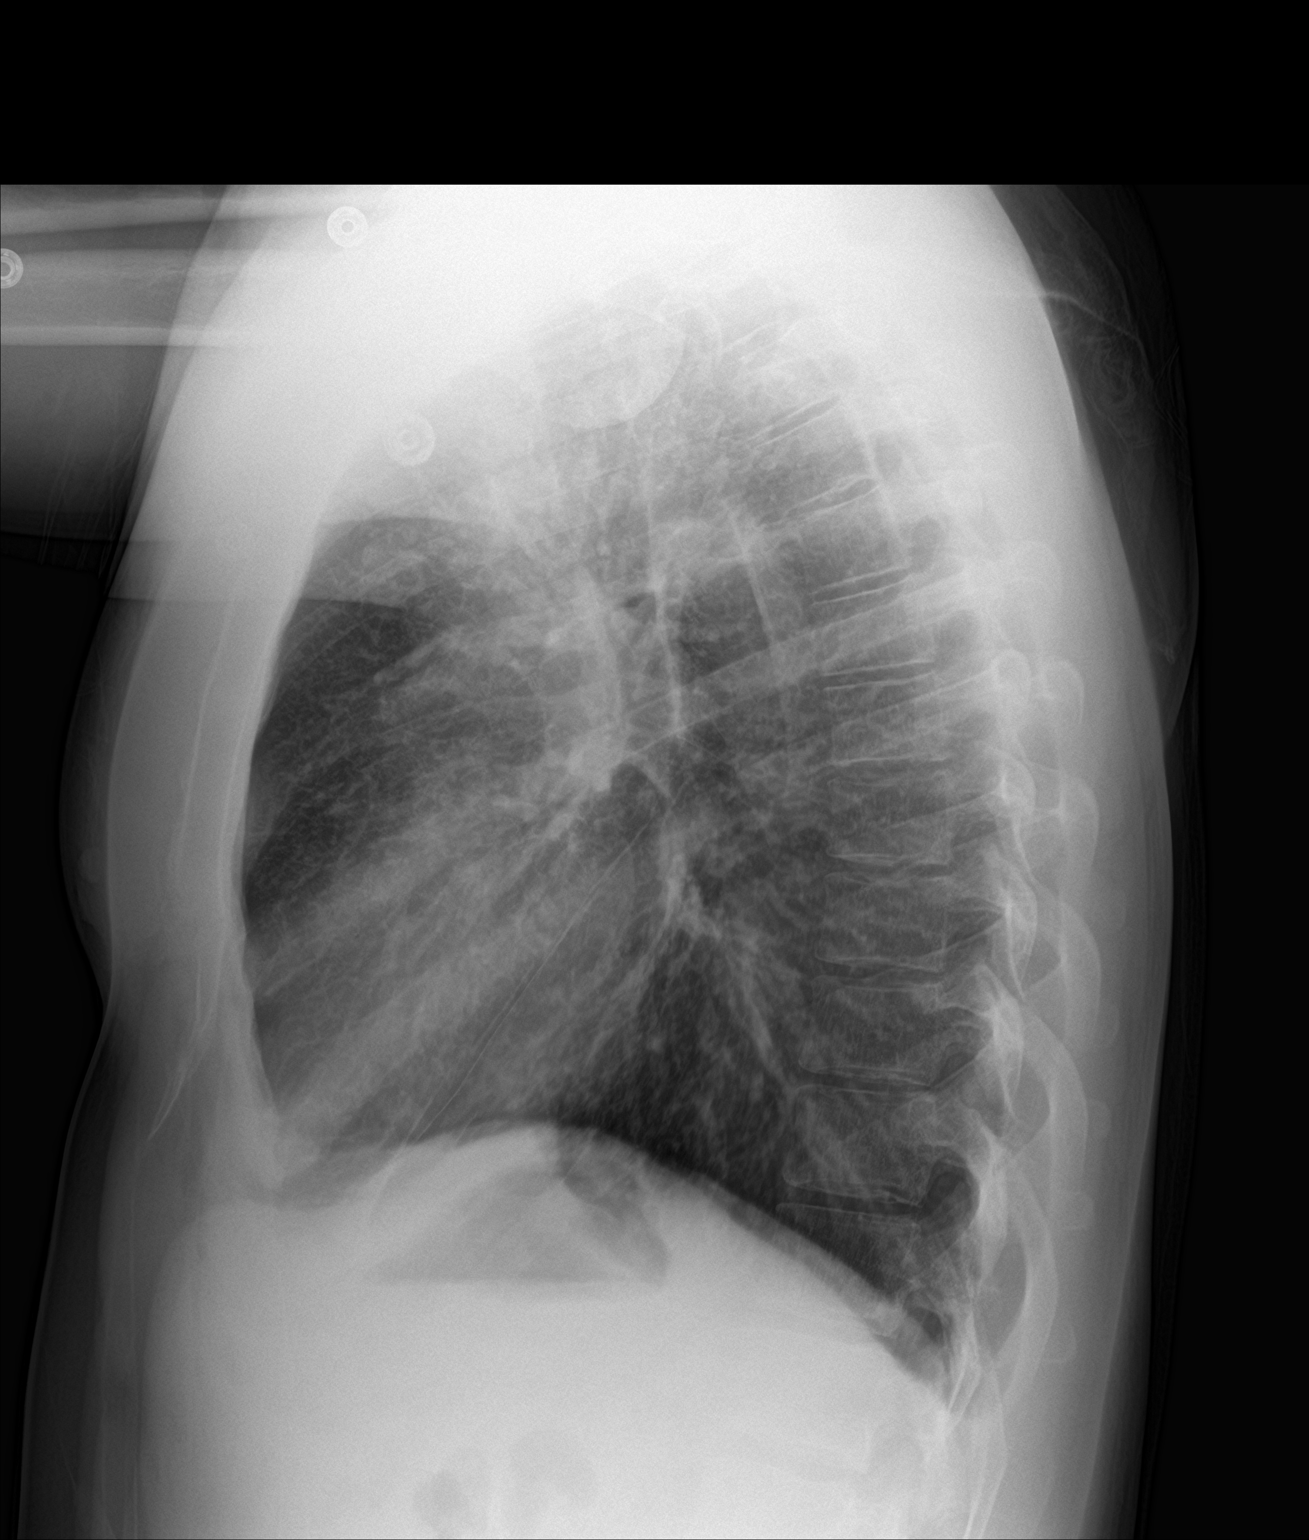

[2 of 2 positions shown; findings below may reference images not displayed]

FINDINGS: Normal cardiac silhouette. There bronchitic markings centrally. No
focal infiltrate. No pneumothorax. No acute osseous abnormality.
IMPRESSION: Bronchitic markings centrally suggests bronchiolitis or bronchitis.

## 2019-10-03 ENCOUNTER — Encounter (HOSPITAL_COMMUNITY): Payer: Self-pay | Admitting: Emergency Medicine

## 2019-10-03 ENCOUNTER — Other Ambulatory Visit: Payer: Self-pay

## 2019-10-03 ENCOUNTER — Emergency Department (HOSPITAL_COMMUNITY)
Admission: EM | Admit: 2019-10-03 | Discharge: 2019-10-03 | Disposition: A | Discharge status: Home / Self Care | Payer: Self-pay | Attending: Emergency Medicine | Admitting: Emergency Medicine

## 2019-10-03 DIAGNOSIS — F419 Anxiety disorder, unspecified: Secondary | ICD-10-CM | POA: Insufficient documentation

## 2019-10-03 DIAGNOSIS — R0602 Shortness of breath: Secondary | ICD-10-CM | POA: Insufficient documentation

## 2019-10-03 DIAGNOSIS — Z79899 Other long term (current) drug therapy: Secondary | ICD-10-CM | POA: Insufficient documentation

## 2019-10-03 DIAGNOSIS — F17210 Nicotine dependence, cigarettes, uncomplicated: Secondary | ICD-10-CM | POA: Insufficient documentation

## 2019-10-03 DIAGNOSIS — E876 Hypokalemia: Secondary | ICD-10-CM | POA: Insufficient documentation

## 2019-10-03 LAB — BASIC METABOLIC PANEL
Anion gap: 10 (ref 5–15)
BUN: 11 mg/dL (ref 6–20)
CO2: 23 mmol/L (ref 22–32)
Calcium: 9.3 mg/dL (ref 8.9–10.3)
Chloride: 98 mmol/L (ref 98–111)
Creatinine, Ser: 1.01 mg/dL (ref 0.61–1.24)
GFR calc Af Amer: >60 mL/min (ref >60)
GFR calc non Af Amer: >60 mL/min (ref >60)
Glucose, Bld: 96 mg/dL (ref 70–99)
Potassium: 3.3 mmol/L — ABNORMAL LOW (ref 3.5–5.1)
Sodium: 131 mmol/L — ABNORMAL LOW (ref 135–145)

## 2019-10-03 LAB — CBC
HCT: 49 % (ref 39.0–52.0)
Hemoglobin: 16.4 g/dL (ref 13.0–17.0)
MCH: 29.2 pg (ref 26.0–34.0)
MCHC: 33.5 g/dL (ref 30.0–36.0)
MCV: 87.2 fL (ref 80.0–100.0)
Platelets: 286 10*3/uL (ref 150–400)
RBC: 5.62 MIL/uL (ref 4.22–5.81)
RDW: 13.2 % (ref 11.5–15.5)
WBC: 11.8 10*3/uL — ABNORMAL HIGH (ref 4.0–10.5)
nRBC: 0 % (ref 0.0–0.2)

## 2019-10-03 LAB — MAGNESIUM: Magnesium: 1.9 mg/dL (ref 1.7–2.4)

## 2019-10-03 MED ORDER — LORAZEPAM 2 MG/ML IJ SOLN
1.0000 mg | Freq: Once | INTRAMUSCULAR | Status: AC
  Administered 2019-10-03: 17:00:00 1 mg via INTRAVENOUS
  Filled 2019-10-03: qty 1

## 2019-10-03 MED ORDER — LORAZEPAM 1 MG PO TABS: 1.0000 mg | ORAL_TABLET | Freq: Three times a day (TID) | ORAL | 0 refills | Status: DC | PRN

## 2019-10-03 NOTE — ED Provider Notes (Signed)
[facility] Provider Note   CSN: [id number] Arrival date & time: 10/03/19  1455     History Chief Complaint  Patient presents with  . Panic Attack    Joshua Bishop is a 39 y.o. male with a history of hypokalemia in the past presented to emergency department reporting cramps in his hands and feet and feeling anxiety.  He is concerned his potassium may be low.  He said he had similar symptoms in the past when he has been told he has low potassium.  He says been going on for about 2 days.  Today he also felt nauseated and vomited.  He denies any active chest pain or shortness of breath.  He reports that his hands and feet have been cramping up.  He feels he may have hyperventilated due to anxiety.  He reports that 2 days ago he drank several alcoholic beverages with friends.  He said he does not drink daily and has no history of alcohol withdrawal or seizures.    He does eat bananas daily.  He does not take potassium supplements daily, but only took it for 2 weeks after his last episode of hypokalemia.  He is denying any diarrhea.  He is not on any new medications  HPI     History reviewed. No pertinent past medical history.  There are no problems to display for this patient.   History reviewed. No pertinent surgical history.     Family History  Problem Relation Age of Onset  . Cancer Other     Social History   Tobacco Use  . Smoking status: Current Every Day Smoker    Packs/day: 0.50    Types: Cigarettes  . Smokeless tobacco: Never Used  Substance Use Topics  . Alcohol use: Yes    Comment: ? binge drinker   . Drug use: Never    Home Medications Prior to Admission medications   Medication Sig Start Date End Date Taking? Authorizing Provider  acetaminophen (TYLENOL) 500 MG tablet Take 1,000 mg by mouth every 6 (six) hours as needed for pain.    [provider]  amoxicillin (AMOXIL) 500 MG capsule Take 1 capsule (500 mg  total) by mouth 3 (three) times daily. 10/19/17   Milton Ferguson, MD  ibuprofen (ADVIL,MOTRIN) 200 MG tablet Take 400 mg by mouth every 6 (six) hours as needed.    [provider]  LORazepam (ATIVAN) 0.5 MG tablet Take 1 tablet (0.5 mg total) by mouth every 8 (eight) hours as needed for anxiety (or panic attacks). 02/07/19   Law, Bea Graff, PA-C  LORazepam (ATIVAN) 1 MG tablet Take 1 tablet (1 mg total) by mouth every 8 (eight) hours as needed for up to 5 doses for anxiety. 10/03/19   Wyvonnia Dusky, MD    Allergies    Patient has no known allergies.  Review of Systems   Review of Systems  Constitutional: Negative for chills and fever.  HENT: Negative for ear pain and sore throat.   Eyes: Negative for photophobia and visual disturbance.  Respiratory: Positive for shortness of breath. Negative for cough.   Cardiovascular: Negative for chest pain and palpitations.  Gastrointestinal: Positive for nausea. Negative for abdominal pain and vomiting.  Genitourinary: Negative for dysuria and hematuria.  Musculoskeletal: Positive for arthralgias and myalgias.  Skin: Negative for color change and rash.  Neurological: Negative for syncope.  Psychiatric/Behavioral: Negative for agitation and confusion.  All other systems reviewed and are negative.  Physical Exam Updated Vital Signs BP (!) 154/93 (BP Location: Right Arm)   Pulse (!) 104   Temp 98.1 F (36.7 C) (Temporal)   Resp 20   Ht 5\' 8"  (1.727 m)   Wt 85.3 kg   SpO2 100%   BMI 28.59 kg/m   Physical Exam Vitals and nursing note reviewed.  Constitutional:      Appearance: He is well-developed.     Comments: Anxious demeanor, fidgeting in bed  HENT:     Head: Normocephalic and atraumatic.  Eyes:     Conjunctiva/sclera: Conjunctivae normal.  Cardiovascular:     Rate and Rhythm: Normal rate and regular rhythm.     Pulses: Normal pulses.     Heart sounds: No murmur.  Pulmonary:     Effort: Pulmonary effort is normal.  No respiratory distress.     Breath sounds: Normal breath sounds.  Abdominal:     Palpations: Abdomen is soft.     Tenderness: There is no abdominal tenderness.  Musculoskeletal:     Cervical back: Neck supple.  Skin:    General: Skin is warm and dry.  Neurological:     General: No focal deficit present.     Mental Status: He is alert and oriented to person, place, and time.     Cranial Nerves: No cranial nerve deficit.     Sensory: No sensory deficit.  Psychiatric:        Mood and Affect: Mood normal.        Behavior: Behavior normal.     ED Results / Procedures / Treatments   Labs (all labs ordered are listed, but only abnormal results are displayed) Labs Reviewed  BASIC METABOLIC PANEL - Abnormal; Notable for the following components:      Result Value   Sodium 131 (*)    Potassium 3.3 (*)    All other components within normal limits  CBC - Abnormal; Notable for the following components:   WBC 11.8 (*)    All other components within normal limits  MAGNESIUM    EKG EKG Interpretation  Date/Time:  Sunday October 03 2019 16:36:18 EDT Ventricular Rate:  93 PR Interval:    QRS Duration: 97 QT Interval:  357 QTC Calculation: 444 R Axis:   84 Text Interpretation: Sinus rhythm No STEMI Confirmed by 1976 [phone]) on 10/03/2019 4:46:26 PM   Radiology No results found.  Procedures Procedures (including critical care time)  Medications Ordered in ED Medications  LORazepam (ATIVAN) injection 1 mg (1 mg Intravenous Given 10/03/19 1654)    ED Course  I have reviewed the triage vital signs and the nursing notes.  Pertinent labs & imaging results that were available during my care of the patient were reviewed by me and considered in my medical decision making (see chart for details).  39 yo male presenting with hand and foot cramping for 2 days, feeling of anxiety, SOB intermittently in waves.  He feels his K+ is low, as this is similar to his prior episodes.      EKG benign on arrival as noted below.  I am doubtful of ACS or PE based on this presentation  Will check CBC, BMP, Mg levels  Patient reporting significant anxiety and discomfort.  Will give 1 mg ativan as anxiolytic.  I confirmed with him and his wife at bedside that he is NOT a daily alcohol drinker, and does not experience these symptoms frequently after not drinking. I don't think this is etoh withdrawal.  Clinical Course as of Oct 04 1134  Sun Oct 03, 2019  1646 ECG per my interpretation shows NSR, no prolonged Qtc, no ectopy, no STEMI   [MT]  1758 Labs reviewed only very mild hypokalemia.  Advised that he take his home p.o. potassium pills once daily for the next week.  Upon further discussion with him and his wife, it appears that he was hyperventilating likely secondary to his anxiety.  This may be causing the tetany and the cramps in his hands.  His wife says that he suffers from these attacks occasionally and, sometimes become too much to manage at home.  She is concerned because she is going into labor soon and thinks he needs medications for this.  She says they are trying to establish care with a new primary care clinic but he does not have insurance yet and having difficulty doing this.  Based on his unique clinical situation, I think is reasonable to prescribe a few tablets of Ativan for his anxiety, for breakthrough panic attacks only.  I told the medications were not a substitute for establishing care with a primary care clinic and I offered him the phone number.  They verbalized understanding.   [MT]    Clinical Course User Index [MT] Tashina Credit, Kermit Balo, MD    Final Clinical Impression(s) / ED Diagnoses Final diagnoses:  Anxiety  Hypokalemia    Rx / DC Orders ED Discharge Orders         Ordered    LORazepam (ATIVAN) 1 MG tablet  Every 8 hours PRN     10/03/19 1758           Terald Sleeper, MD 10/04/19 1136

## 2019-10-03 NOTE — ED Triage Notes (Signed)
Pt reports that he does  Not drink daily but when he does,  " a 12 pack lasts me all night"  restless in triage

## 2019-10-03 NOTE — ED Triage Notes (Signed)
Pt reports "panic attack usually 2 days after drinking"   Drank "12 pack" 2 days ago   Reports panic attack x one hour "hands are locking up"   Here for eval

## 2019-10-03 NOTE — Discharge Instructions (Addendum)
Take your potassium pills at home once daily for the next 7 days.

## 2019-11-28 ENCOUNTER — Emergency Department (HOSPITAL_COMMUNITY)
Admission: EM | Admit: 2019-11-28 | Discharge: 2019-11-28 | Disposition: A | Discharge status: Home / Self Care | Payer: Self-pay | Attending: Emergency Medicine | Admitting: Emergency Medicine

## 2019-11-28 ENCOUNTER — Other Ambulatory Visit: Payer: Self-pay

## 2019-11-28 ENCOUNTER — Encounter (HOSPITAL_COMMUNITY): Payer: Self-pay | Admitting: Emergency Medicine

## 2019-11-28 DIAGNOSIS — F410 Panic disorder [episodic paroxysmal anxiety] without agoraphobia: Secondary | ICD-10-CM | POA: Insufficient documentation

## 2019-11-28 DIAGNOSIS — F419 Anxiety disorder, unspecified: Secondary | ICD-10-CM | POA: Insufficient documentation

## 2019-11-28 DIAGNOSIS — F17210 Nicotine dependence, cigarettes, uncomplicated: Secondary | ICD-10-CM | POA: Insufficient documentation

## 2019-11-28 DIAGNOSIS — R002 Palpitations: Secondary | ICD-10-CM | POA: Insufficient documentation

## 2019-11-28 DIAGNOSIS — R0682 Tachypnea, not elsewhere classified: Secondary | ICD-10-CM | POA: Insufficient documentation

## 2019-11-28 DIAGNOSIS — R202 Paresthesia of skin: Secondary | ICD-10-CM | POA: Insufficient documentation

## 2019-11-28 HISTORY — DX: Panic disorder (episodic paroxysmal anxiety): F41.0

## 2019-11-28 MED ORDER — LORAZEPAM 1 MG PO TABS
1.0000 mg | ORAL_TABLET | Freq: Once | ORAL | Status: AC
  Administered 2019-11-28: 1 mg via SUBLINGUAL
  Filled 2019-11-28: qty 1

## 2019-11-28 MED ORDER — HYDROXYZINE HCL 25 MG PO TABS: 25.0000 mg | ORAL_TABLET | Freq: Four times a day (QID) | ORAL | 0 refills | Status: DC

## 2019-11-28 MED ORDER — HYDROXYZINE HCL 25 MG PO TABS: 25.0000 mg | ORAL_TABLET | Freq: Four times a day (QID) | ORAL | 0 refills | Status: DC | PRN

## 2019-11-28 NOTE — ED Provider Notes (Signed)
[facility] EMERGENCY DEPARTMENT Provider Note   CSN: [id number] Arrival date & time: 11/28/19  0341     History Chief Complaint  Patient presents with  . Panic Attack    Joshua Bishop is a 39 y.o. male.  Patient presents to the emergency department for evaluation of panic attack.  He reports that he was awakened from sleep with sensation of severe anxiety, racing heartbeat, uncontrolled fast breathing with numbness, tingling and spasms of the wrists and hands.        Past Medical History:  Diagnosis Date  . Panic attack     There are no problems to display for this patient.   History reviewed. No pertinent surgical history.     Family History  Problem Relation Age of Onset  . Cancer Other     Social History   Tobacco Use  . Smoking status: Current Every Day Smoker    Packs/day: 0.50    Types: Cigarettes  . Smokeless tobacco: Never Used  Substance Use Topics  . Alcohol use: Yes    Comment: ? binge drinker   . Drug use: Never    Home Medications Prior to Admission medications   Medication Sig Start Date End Date Taking? Authorizing Provider  acetaminophen (TYLENOL) 500 MG tablet Take 1,000 mg by mouth every 6 (six) hours as needed for pain.    [provider]  hydrOXYzine (ATARAX/VISTARIL) 25 MG tablet Take 1 tablet (25 mg total) by mouth every 6 (six) hours as needed for anxiety. 11/28/19   Orpah Greek, MD  ibuprofen (ADVIL,MOTRIN) 200 MG tablet Take 400 mg by mouth every 6 (six) hours as needed.    [provider]    Allergies    Patient has no known allergies.  Review of Systems   Review of Systems  Psychiatric/Behavioral: The patient is nervous/anxious.   All other systems reviewed and are negative.   Physical Exam Updated Vital Signs BP (!) 148/109 (BP Location: Right Arm)   Pulse 75   Temp 97.6 F (36.4 C) (Oral)   Resp (!) 22   Ht 5\' 8"  (1.727 m)   Wt 83.9 kg   SpO2 99%   BMI 28.13 kg/m    Physical Exam Vitals and nursing note reviewed.  Constitutional:      General: He is not in acute distress.    Appearance: Normal appearance. He is well-developed.  HENT:     Head: Normocephalic and atraumatic.     Right Ear: Hearing normal.     Left Ear: Hearing normal.     Nose: Nose normal.  Eyes:     Conjunctiva/sclera: Conjunctivae normal.     Pupils: Pupils are equal, round, and reactive to light.  Cardiovascular:     Rate and Rhythm: Regular rhythm.     Heart sounds: S1 normal and S2 normal. No murmur heard.  No friction rub. No gallop.   Pulmonary:     Effort: Pulmonary effort is normal. No respiratory distress.     Breath sounds: Normal breath sounds.  Chest:     Chest wall: No tenderness.  Abdominal:     General: Bowel sounds are normal.     Palpations: Abdomen is soft.     Tenderness: There is no abdominal tenderness. There is no guarding or rebound. Negative signs include Murphy's sign and McBurney's sign.     Hernia: No hernia is present.  Musculoskeletal:        General: Normal range of motion.  Cervical back: Normal range of motion and neck supple.  Skin:    General: Skin is warm and dry.     Findings: No rash.  Neurological:     Mental Status: He is alert and oriented to person, place, and time.     GCS: GCS eye subscore is 4. GCS verbal subscore is 5. GCS motor subscore is 6.     Cranial Nerves: No cranial nerve deficit.     Sensory: No sensory deficit.     Coordination: Coordination normal.  Psychiatric:        Speech: Speech normal.        Behavior: Behavior normal.        Thought Content: Thought content normal.     ED Results / Procedures / Treatments   Labs (all labs ordered are listed, but only abnormal results are displayed) Labs Reviewed - No data to display  EKG None  Radiology No results found.  Procedures Procedures (including critical care time)  Medications Ordered in ED Medications  LORazepam (ATIVAN) tablet 1 mg (1  mg Sublingual Given 11/28/19 0409)    ED Course  I have reviewed the triage vital signs and the nursing notes.  Pertinent labs & imaging results that were available during my care of the patient were reviewed by me and considered in my medical decision making (see chart for details).    MDM Rules/Calculators/A&P                          Patient with history of panic disorder presents to the emergency department complaining of panic attack.  Symptoms are consistent with hyperventilation syndrome.  No work-up necessary. Final Clinical Impression(s) / ED Diagnoses Final diagnoses:  Panic attack    Rx / DC Orders ED Discharge Orders         Ordered    hydrOXYzine (ATARAX/VISTARIL) 25 MG tablet  Every 6 hours,   Status:  Discontinued     Reprint     11/28/19 0415    hydrOXYzine (ATARAX/VISTARIL) 25 MG tablet  Every 6 hours PRN     Discontinue  Reprint     11/28/19 0415           Gilda Crease, MD 11/28/19 [phone]

## 2019-11-28 NOTE — ED Triage Notes (Signed)
Pt states he was sleeping and woke up having a panic attack "about an hour ago" says  His arms and lips are numb and he is breathing heavily. Pt states he has had panic attacks for approximately 2 yrs and "was supposed to see a doctor but doesn't have any money for that".

## 2020-07-24 IMAGING — DX PORTABLE CHEST - 1 VIEW
1 series · 1 of 1 positions shown · non-contrast
Comparison: 10/19/2017

CLINICAL DATA: Shortness of breath.

EXAM:
PORTABLE CHEST 1 VIEW

[chest]
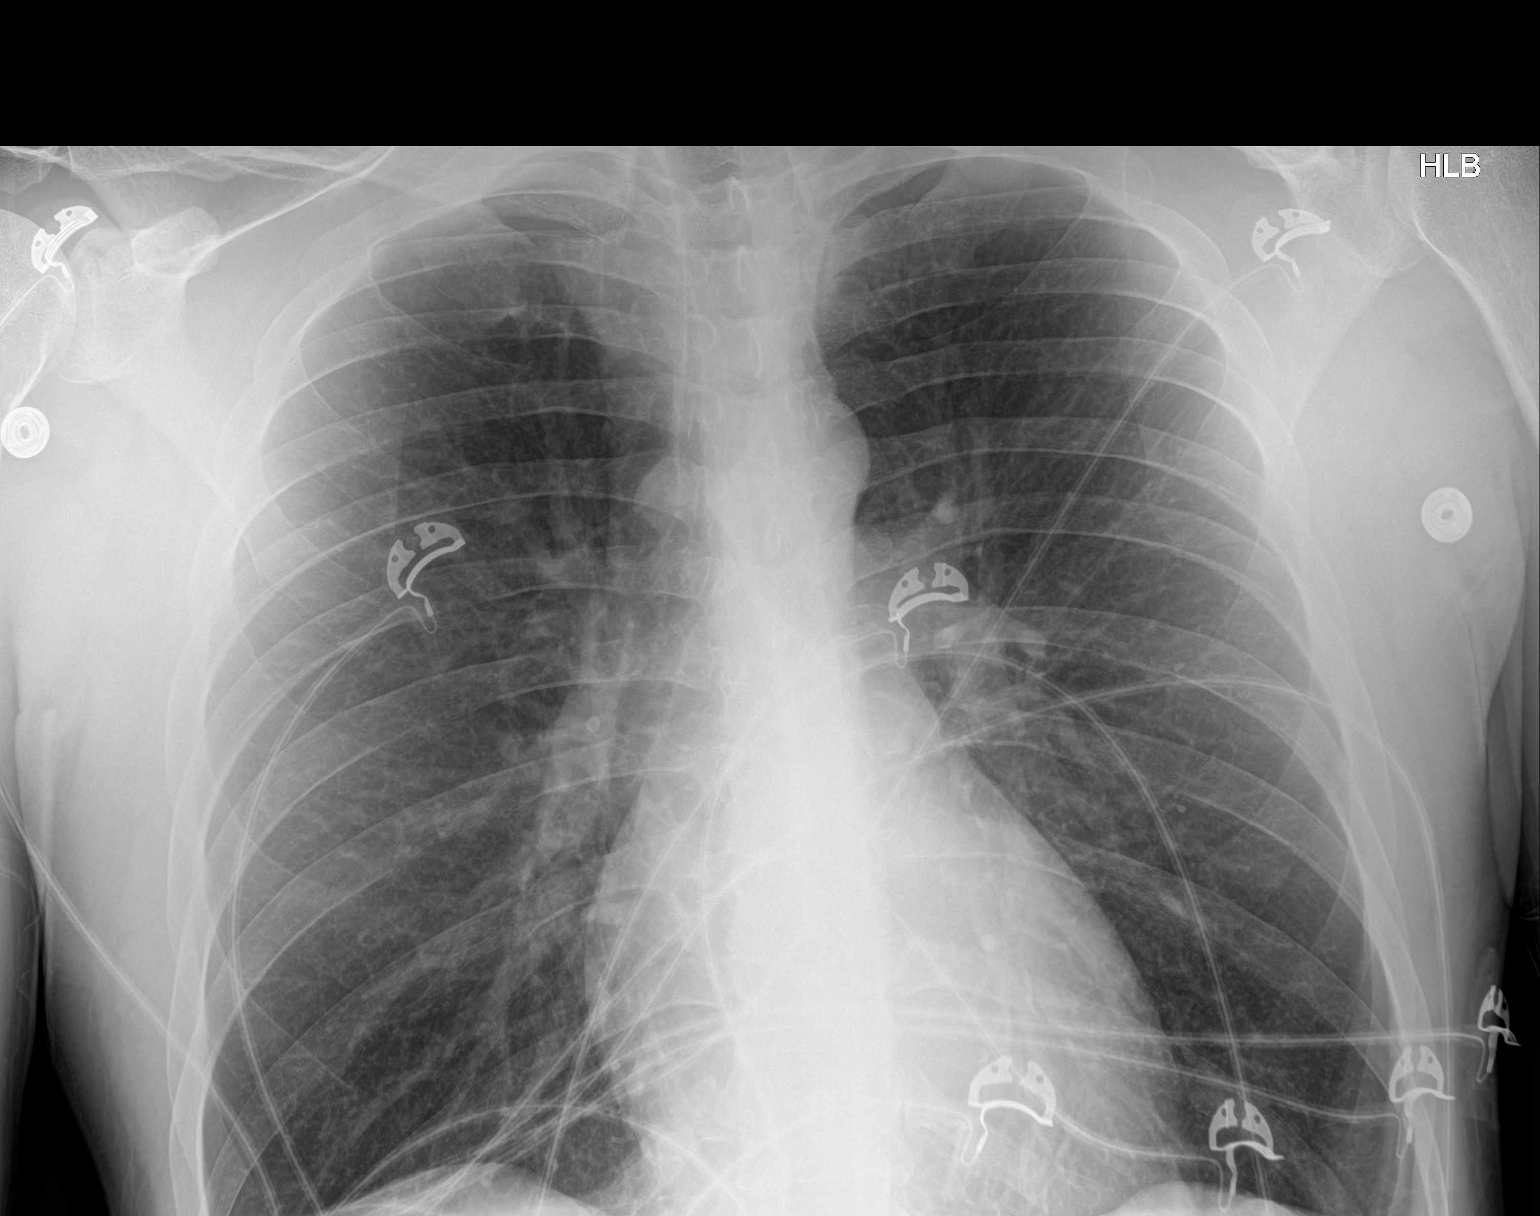

[1 of 1 positions shown; findings below may reference images not displayed]

FINDINGS: The heart size and mediastinal contours are within normal limits.
There is no evidence of pulmonary edema, consolidation,
pneumothorax, nodule or pleural fluid. The visualized skeletal
structures are unremarkable.
IMPRESSION: No active disease.

## 2021-12-19 ENCOUNTER — Other Ambulatory Visit: Payer: Self-pay

## 2021-12-19 ENCOUNTER — Emergency Department (HOSPITAL_COMMUNITY)
Admission: EM | Admit: 2021-12-19 | Discharge: 2021-12-19 | Disposition: A | Discharge status: Home / Self Care | Payer: Self-pay | Attending: Emergency Medicine | Admitting: Emergency Medicine

## 2021-12-19 ENCOUNTER — Encounter (HOSPITAL_COMMUNITY): Payer: Self-pay

## 2021-12-19 DIAGNOSIS — X58XXXA Exposure to other specified factors, initial encounter: Secondary | ICD-10-CM | POA: Insufficient documentation

## 2021-12-19 DIAGNOSIS — S6990XA Unspecified injury of unspecified wrist, hand and finger(s), initial encounter: Secondary | ICD-10-CM

## 2021-12-19 DIAGNOSIS — Z23 Encounter for immunization: Secondary | ICD-10-CM | POA: Insufficient documentation

## 2021-12-19 DIAGNOSIS — S60456A Superficial foreign body of right little finger, initial encounter: Secondary | ICD-10-CM | POA: Insufficient documentation

## 2021-12-19 MED ORDER — POVIDONE-IODINE 10 % EX SOLN
CUTANEOUS | Status: AC
  Filled 2021-12-19: qty 14.8

## 2021-12-19 MED ORDER — LIDOCAINE HCL (PF) 2 % IJ SOLN
INTRAMUSCULAR | Status: AC
  Filled 2021-12-19: qty 10

## 2021-12-19 MED ORDER — TETANUS-DIPHTH-ACELL PERTUSSIS 5-2.5-18.5 LF-MCG/0.5 IM SUSY
0.5000 mL | PREFILLED_SYRINGE | Freq: Once | INTRAMUSCULAR | Status: AC
  Administered 2021-12-19: 0.5 mL via INTRAMUSCULAR
  Filled 2021-12-19: qty 0.5

## 2021-12-19 MED ORDER — POVIDONE-IODINE 10 % OINT PACKET: TOPICAL_OINTMENT | Freq: Once | CUTANEOUS | Status: DC

## 2021-12-19 MED ORDER — CEPHALEXIN 500 MG PO CAPS: 500.0000 mg | ORAL_CAPSULE | Freq: Three times a day (TID) | ORAL | 0 refills | Status: DC

## 2021-12-19 MED ORDER — LIDOCAINE HCL (PF) 2 % IJ SOLN: 2.0000 mL | Freq: Once | INTRAMUSCULAR | Status: DC

## 2021-12-19 NOTE — Discharge Instructions (Signed)
Keep the finger clean with antibacterial soap and water.  You may keep it bandaged.  Take the antibiotic as directed.  Return to the emergency department for any signs of infection.

## 2021-12-19 NOTE — ED Provider Notes (Signed)
[facility] EMERGENCY DEPARTMENT Provider Note   CSN: [id number] Arrival date & time: 12/19/21  1936     History  Chief Complaint  Patient presents with   Fish Hook in Hand    Joshua Bishop is a 41 y.o. male.  HPI      Joshua Bishop is a 41 y.o. male who presents to the Emergency Department with foreign body of the skin to the right fifth finger.  He states that he was moving some fishing rods and has a fishhook stuck in his finger.  He and his brother attempted to remove the fishhook unsuccessfully.  A portion of the hook was cut off prior to arrival.  He has pain at the puncture site, he denies any numbness swelling or tingling of his finger.  Last Td is unknown.   Home Medications Prior to Admission medications   Medication Sig Start Date End Date Taking? Authorizing Provider  acetaminophen (TYLENOL) 500 MG tablet Take 1,000 mg by mouth every 6 (six) hours as needed for pain.   Yes [provider]  cephALEXin (KEFLEX) 500 MG capsule Take 1 capsule (500 mg total) by mouth 3 (three) times daily. 12/19/21  Yes Kosisochukwu Burningham, PA-C  ibuprofen (ADVIL,MOTRIN) 200 MG tablet Take 400 mg by mouth every 6 (six) hours as needed.   Yes [provider]  hydrOXYzine (ATARAX/VISTARIL) 25 MG tablet Take 1 tablet (25 mg total) by mouth every 6 (six) hours as needed for anxiety. Patient not taking: Reported on 12/19/2021 11/28/19   Gilda Crease, MD      Allergies    Zidovudine    Review of Systems   Review of Systems  Constitutional:  Negative for chills and fever.  Skin:  Positive for wound (Fishhook skin right fifth finger). Negative for color change.  Neurological:  Negative for weakness and numbness.    Physical Exam Updated Vital Signs BP (!) 149/103 (BP Location: Right Arm)   Pulse 65   Temp 97.8 F (36.6 C) (Oral)   Resp 19   Ht 5\' 8"  (1.727 m)   Wt 85.7 kg   SpO2 97%   BMI 28.74 kg/m  Physical Exam Vitals and nursing note reviewed.   Constitutional:      General: He is not in acute distress.    Appearance: Normal appearance. He is not ill-appearing.  Cardiovascular:     Rate and Rhythm: Normal rate and regular rhythm.     Pulses: Normal pulses.  Pulmonary:     Effort: Pulmonary effort is normal.     Breath sounds: Normal breath sounds.  Musculoskeletal:        General: Signs of injury present. No swelling. Normal range of motion.     Right hand: Tenderness present. No swelling or bony tenderness. Normal range of motion. Normal strength. Normal sensation. There is no disruption of two-point discrimination. Normal capillary refill. Normal pulse.     Comments: Fishhook embedded to the proximal right fifth finger.  No edema or active bleeding.  Skin:    General: Skin is warm.     Capillary Refill: Capillary refill takes less than 2 seconds.  Neurological:     General: No focal deficit present.     Mental Status: He is alert.     Sensory: No sensory deficit.     Motor: No weakness.     ED Results / Procedures / Treatments   Labs (all labs ordered are listed, but only abnormal results are displayed) Labs  Reviewed - No data to display  EKG None  Radiology No results found.  Procedures .Foreign Body Removal  Date/Time: 12/19/2021 9:36 PM  Performed by: Pauline Aus, PA-C Authorized by: Pauline Aus, PA-C  Consent: Verbal consent obtained. Risks and benefits: risks, benefits and alternatives were discussed Consent given by: patient Patient understanding: patient states understanding of the procedure being performed Required items: required blood products, implants, devices, and special equipment available Patient identity confirmed: verbally with patient and arm band Time out: Immediately prior to procedure a "time out" was called to verify the correct patient, procedure, equipment, support staff and site/side marked as required. Body area: skin General location: upper extremity Location details:  right small finger Anesthesia: local infiltration  Anesthesia: Local Anesthetic: lidocaine 2% without epinephrine Anesthetic total: 1 mL  Sedation: Patient sedated: no  Patient restrained: no Patient cooperative: yes Localization method: visualized Removal mechanism: 18 g needle. Dressing: dressing applied Tendon involvement: none Depth: subcutaneous Complexity: simple 1 objects recovered. Objects recovered: fish hook Post-procedure assessment: foreign body removed Patient tolerance: patient tolerated the procedure well with no immediate complications        Medications Ordered in ED Medications  lidocaine HCl (PF) (XYLOCAINE) 2 % injection 2 mL (has no administration in time range)  povidone-iodine (BETADINE) 10 % ointment (has no administration in time range)  povidone-iodine (BETADINE) 10 % external solution (has no administration in time range)  Tdap (BOOSTRIX) injection 0.5 mL (0.5 mLs Intramuscular Given 12/19/21 2025)    ED Course/ Medical Decision Making/ A&P                           Medical Decision Making Risk Prescription drug management.   Patient here for removal of fishhook to the skin of the proximal right fifth finger.  Successfully removed by me using 18-gauge needle.  Td updated here.  Wound explored no further foreign body seen.  Wound care instructions discussed.  Patient given return precautions.         Final Clinical Impression(s) / ED Diagnoses Final diagnoses:  Fish hook in finger    Rx / DC Orders ED Discharge Orders          Ordered    cephALEXin (KEFLEX) 500 MG capsule  3 times daily        12/19/21 2124              Pauline Aus, PA-C 12/19/21 2319    Linwood Dibbles, MD 12/21/21 [phone]

## 2021-12-19 NOTE — ED Triage Notes (Signed)
Pov from home. Cc of small fish hook in right pinky finger. Tried to pull it out but couldn't. Will need tetanus

## 2022-01-21 ENCOUNTER — Encounter (HOSPITAL_COMMUNITY): Payer: Self-pay

## 2022-01-21 ENCOUNTER — Emergency Department (HOSPITAL_COMMUNITY)
Admission: EM | Admit: 2022-01-21 | Discharge: 2022-01-21 | Disposition: A | Discharge status: Home / Self Care | Payer: Self-pay | Attending: Emergency Medicine | Admitting: Emergency Medicine

## 2022-01-21 ENCOUNTER — Other Ambulatory Visit: Payer: Self-pay

## 2022-01-21 DIAGNOSIS — L237 Allergic contact dermatitis due to plants, except food: Secondary | ICD-10-CM | POA: Insufficient documentation

## 2022-01-21 MED ORDER — PREDNISONE 10 MG (21) PO TBPK: ORAL_TABLET | Freq: Every day | ORAL | 0 refills | Status: DC

## 2022-01-21 NOTE — ED Triage Notes (Signed)
Pt reports broke out in rash after weed eating  2 days ago.  Has been using benadryl without relief.

## 2022-01-21 NOTE — Discharge Instructions (Addendum)
You can continue to use Benadryl as needed to help with itching, I recommend you use Sarna anti-itch lotion which is an over-the-counter product that can help with your itching.  Please take the steroids as I prescribed to help with your rash symptoms.

## 2022-01-21 NOTE — ED Provider Notes (Signed)
[facility] EMERGENCY DEPARTMENT Provider Note   CSN: [id number] Arrival date & time: 01/21/22  6967     History  Chief Complaint  Patient presents with   Rash    Joshua Bishop is a 41 y.o. male with past medical history significant with known history of poison oak dermatitis who presents after new exposure to poison oak 2 days ago.  Patient reports that he has been significantly itchy, has scattered lesions around his body, and skin and scalp.  Patient denies any throat swelling,, difficulty breathing.  He has not tried any topical cream, hydrocortisone.   Rash      Home Medications Prior to Admission medications   Medication Sig Start Date End Date Taking? Authorizing Provider  predniSONE (STERAPRED UNI-PAK 21 TAB) 10 MG (21) TBPK tablet Take by mouth daily. Take 6 tabs by mouth daily  for 2 days, then 5 tabs for 2 days, then 4 tabs for 2 days, then 3 tabs for 2 days, 2 tabs for 2 days, then 1 tab by mouth daily for 2 days 01/21/22  Yes Jaiel Saraceno H, PA-C  acetaminophen (TYLENOL) 500 MG tablet Take 1,000 mg by mouth every 6 (six) hours as needed for pain.    [provider]  cephALEXin (KEFLEX) 500 MG capsule Take 1 capsule (500 mg total) by mouth 3 (three) times daily. 12/19/21   Triplett, Tammy, PA-C  hydrOXYzine (ATARAX/VISTARIL) 25 MG tablet Take 1 tablet (25 mg total) by mouth every 6 (six) hours as needed for anxiety. Patient not taking: Reported on 12/19/2021 11/28/19   Gilda Crease, MD  ibuprofen (ADVIL,MOTRIN) 200 MG tablet Take 400 mg by mouth every 6 (six) hours as needed.    [provider]      Allergies    Zidovudine    Review of Systems   Review of Systems  Skin:  Positive for rash.  All other systems reviewed and are negative.   Physical Exam Updated Vital Signs BP 129/78 (BP Location: Left Arm)   Pulse 61   Temp 98 F (36.7 C) (Oral)   Resp 18   Ht 5\' 8"  (1.727 m)   Wt 83.9 kg   SpO2 100%   BMI 28.13 kg/m   Physical Exam Vitals and nursing note reviewed.  Constitutional:      General: He is not in acute distress.    Appearance: Normal appearance.  HENT:     Head: Normocephalic and atraumatic.  Eyes:     General:        Right eye: No discharge.        Left eye: No discharge.  Cardiovascular:     Rate and Rhythm: Normal rate and regular rhythm.  Pulmonary:     Effort: Pulmonary effort is normal. No respiratory distress.  Musculoskeletal:        General: No deformity.  Skin:    General: Skin is warm and dry.     Comments: Scattered linear vesicular rash in a pattern that matches the distribution of exposure to poison oak.  No significant seeping, no evidence of secondary infection, impetigo overlying rash.  Evidence of some itching excoriations noted.  Neurological:     Mental Status: He is alert and oriented to person, place, and time.  Psychiatric:        Mood and Affect: Mood normal.        Behavior: Behavior normal.     ED Results / Procedures / Treatments   Labs (all labs ordered  are listed, but only abnormal results are displayed) Labs Reviewed - No data to display  EKG None  Radiology No results found.  Procedures Procedures    Medications Ordered in ED Medications - No data to display  ED Course/ Medical Decision Making/ A&P                           Medical Decision Making Risk Prescription drug management.   An overall well-appearing 41 year old male who presents with concern for poison oak dermatitis.  Patient with history of same.  Physical exam with linear rash in distribution matching exposure to plants, consistent with poison oak dermatitis.  Discussed treatment with topical steroids versus oral steroids versus over-the-counter medications.  Given the extent of patient's rash think it be reasonable to try oral steroid taper Dosepak.  Discussed side effects with patient and he agrees with this plan.  Patient discharged stable condition at this time,  encouraged to use over-the-counter Sarna anti-itch cream in addition to steroids.  Encouraged to follow-up with PCP for recheck.  Return precautions given.  Patient discharged in stable condition at this time. Final Clinical Impression(s) / ED Diagnoses Final diagnoses:  Poison oak dermatitis    Rx / DC Orders ED Discharge Orders          Ordered    predniSONE (STERAPRED UNI-PAK 21 TAB) 10 MG (21) TBPK tablet  Daily        01/21/22 1033              Derek Huneycutt, [place] H, PA-C 01/21/22 1041    Tanda Rockers A, DO 01/24/22 0745

## 2022-09-16 DIAGNOSIS — I609 Nontraumatic subarachnoid hemorrhage, unspecified: Secondary | ICD-10-CM

## 2022-09-16 HISTORY — DX: Nontraumatic subarachnoid hemorrhage, unspecified: I60.9

## 2022-09-19 ENCOUNTER — Emergency Department (HOSPITAL_COMMUNITY): Payer: Medicaid Other

## 2022-09-19 ENCOUNTER — Emergency Department (HOSPITAL_COMMUNITY)
Admission: EM | Admit: 2022-09-19 | Discharge: 2022-09-19 | Disposition: A | Discharge status: Home / Self Care | Payer: Medicaid Other | Attending: Emergency Medicine | Admitting: Emergency Medicine

## 2022-09-19 ENCOUNTER — Other Ambulatory Visit: Payer: Self-pay

## 2022-09-19 ENCOUNTER — Encounter (HOSPITAL_COMMUNITY): Payer: Self-pay

## 2022-09-19 DIAGNOSIS — S066X9A Traumatic subarachnoid hemorrhage with loss of consciousness of unspecified duration, initial encounter: Secondary | ICD-10-CM | POA: Diagnosis not present

## 2022-09-19 DIAGNOSIS — S0990XA Unspecified injury of head, initial encounter: Secondary | ICD-10-CM | POA: Diagnosis present

## 2022-09-19 DIAGNOSIS — W19XXXA Unspecified fall, initial encounter: Secondary | ICD-10-CM

## 2022-09-19 DIAGNOSIS — W11XXXA Fall on and from ladder, initial encounter: Secondary | ICD-10-CM | POA: Diagnosis not present

## 2022-09-19 LAB — BASIC METABOLIC PANEL
Anion gap: 9 (ref 5–15)
BUN: 10 mg/dL (ref 6–20)
CO2: 22 mmol/L (ref 22–32)
Calcium: 9.2 mg/dL (ref 8.9–10.3)
Chloride: 106 mmol/L (ref 98–111)
Creatinine, Ser: 0.72 mg/dL (ref 0.61–1.24)
GFR, Estimated: >60 mL/min (ref >60)
Glucose, Bld: 97 mg/dL (ref 70–99)
Potassium: 4 mmol/L (ref 3.5–5.1)
Sodium: 137 mmol/L (ref 135–145)

## 2022-09-19 LAB — CBC WITH DIFFERENTIAL/PLATELET
Abs Immature Granulocytes: 0.01 10*3/uL (ref 0.00–0.07)
Basophils Absolute: 0 10*3/uL (ref 0.0–0.1)
Basophils Relative: 1 %
Eosinophils Absolute: 0.1 10*3/uL (ref 0.0–0.5)
Eosinophils Relative: 2 %
HCT: 43.9 % (ref 39.0–52.0)
Hemoglobin: 15.2 g/dL (ref 13.0–17.0)
Immature Granulocytes: 0 %
Lymphocytes Relative: 24 %
Lymphs Abs: 1.2 10*3/uL (ref 0.7–4.0)
MCH: 30.2 pg (ref 26.0–34.0)
MCHC: 34.6 g/dL (ref 30.0–36.0)
MCV: 87.1 fL (ref 80.0–100.0)
Monocytes Absolute: 0.3 10*3/uL (ref 0.1–1.0)
Monocytes Relative: 7 %
Neutro Abs: 3.3 10*3/uL (ref 1.7–7.7)
Neutrophils Relative %: 66 %
Platelets: 227 10*3/uL (ref 150–400)
RBC: 5.04 MIL/uL (ref 4.22–5.81)
RDW: 13.1 % (ref 11.5–15.5)
WBC: 5 10*3/uL (ref 4.0–10.5)
nRBC: 0 % (ref 0.0–0.2)

## 2022-09-19 LAB — I-STAT CHEM 8, ED
BUN: 13 mg/dL (ref 6–20)
Calcium, Ion: 1.21 mmol/L (ref 1.15–1.40)
Chloride: 105 mmol/L (ref 98–111)
Creatinine, Ser: 0.7 mg/dL (ref 0.61–1.24)
Glucose, Bld: 95 mg/dL (ref 70–99)
HCT: 43 % (ref 39.0–52.0)
Hemoglobin: 14.6 g/dL (ref 13.0–17.0)
Potassium: 4.1 mmol/L (ref 3.5–5.1)
Sodium: 140 mmol/L (ref 135–145)
TCO2: 25 mmol/L (ref 22–32)

## 2022-09-19 MED ORDER — ACETAMINOPHEN 325 MG PO TABS
650.0000 mg | ORAL_TABLET | Freq: Four times a day (QID) | ORAL | Status: DC | PRN
  Administered 2022-09-19: 650 mg via ORAL
  Filled 2022-09-19: qty 2

## 2022-09-19 MED ORDER — IOHEXOL 350 MG/ML SOLN
75.0000 mL | Freq: Once | INTRAVENOUS | Status: AC | PRN
  Administered 2022-09-19: 75 mL via INTRAVENOUS

## 2022-09-19 NOTE — Discharge Instructions (Addendum)
Your CT scan shows a small amount of blood on the brain from your fall, this should reabsorb on its own over time.  With this you may have some symptoms of a concussion like those listed below.  You should follow-up with the neurosurgery office in the next few weeks, please call to schedule an appointment with them.  A concussion is a very mild traumatic brain injury caused by a bump, jolt or blow to the head, most people recover quickly and fully. You can experience a wide variety of symptoms including:   - Confusion      - Difficulty concentrating       - Trouble remembering new info  - Headache      - Dizziness        - Fuzzy or blurry vision  - Fatigue      - Balance problems      - Light sensitivity  - Mood swings     - Changes in sleep or difficulty sleeping   To help these symptoms improve make sure you are getting plenty of rest, avoid screen time, loud music and strenuous mental activities. Avoid any strenuous physical activities, once your symptoms have resolved a slow and gradual return to activity is recommended. It is very important that you avoid situations in which you might sustain a second head injury as this can be very dangerous and life threatening.   Sometimes serious problems can develop after a head injury. Please return to the emergency department if you experience any of the following symptoms: Repeated vomiting Headache that gets worse and does not go away Loss of consciousness or inability to stay awake at times when you   normally would be able to Getting more confused, restless or agitated Convulsions or seizures Difficulty walking or feeling off balance Weakness or numbness Vision changes

## 2022-09-19 NOTE — ED Provider Notes (Signed)
[place] EMERGENCY DEPARTMENT AT [facility] Provider Note   CSN: [id number] Arrival date & time: 09/19/22  4098     History  Chief Complaint  Patient presents with   Fall   Loss of Consciousness    Joshua Bishop is a 42 y.o. male.  Joshua Bishop is a 42 y.o. male with hx of panic attacks, who presents to the ED for evaluation of head injury after a fall. Pt reports that he was about 4 foot up on a ladder and slipped falling backwards into the empty concrete pool we was climbing out of with the ladder. Pt reports he lost consciousness after he hit his head and estimates it lasted a few minutes at most. No seizure activity witnessed by family. Patient reports some mild nausea but denies vomiting. Patient denies vision changes, does report feeling a bit dizzy after the fall.  He reports he noticed a golf ball sized knot on the back of his head but has not noted any bleeding.  Patient denies being on blood thinners.  Patient reports feeling anxious and took a Xanax prior to arrival, denies other substance use and denies alcohol use.   The history is provided by the patient and the spouse.  Fall Associated symptoms include headaches.  Loss of Consciousness Associated symptoms: dizziness and headaches   Associated symptoms: no fever, no seizures and no weakness        Home Medications Prior to Admission medications   Medication Sig Start Date End Date Taking? Authorizing Provider  acetaminophen (TYLENOL) 500 MG tablet Take 1,000 mg by mouth every 6 (six) hours as needed for pain.    [provider]  cephALEXin (KEFLEX) 500 MG capsule Take 1 capsule (500 mg total) by mouth 3 (three) times daily. 12/19/21   Triplett, Tammy, PA-C  hydrOXYzine (ATARAX/VISTARIL) 25 MG tablet Take 1 tablet (25 mg total) by mouth every 6 (six) hours as needed for anxiety. Patient not taking: Reported on 12/19/2021 11/28/19   Gilda Crease, MD  ibuprofen (ADVIL,MOTRIN) 200 MG  tablet Take 400 mg by mouth every 6 (six) hours as needed.    [provider]  predniSONE (STERAPRED UNI-PAK 21 TAB) 10 MG (21) TBPK tablet Take by mouth daily. Take 6 tabs by mouth daily  for 2 days, then 5 tabs for 2 days, then 4 tabs for 2 days, then 3 tabs for 2 days, 2 tabs for 2 days, then 1 tab by mouth daily for 2 days 01/21/22   Prosperi, Christian H, PA-C      Allergies    Zidovudine    Review of Systems   Review of Systems  Constitutional:  Negative for chills and fever.  Cardiovascular:  Positive for syncope.  Musculoskeletal:  Positive for neck pain. Negative for arthralgias and back pain.  Neurological:  Positive for dizziness and headaches. Negative for seizures, syncope, facial asymmetry, weakness, light-headedness and numbness.  All other systems reviewed and are negative.   Physical Exam Updated Vital Signs BP (!) 147/96   Pulse (!) 58   Temp 98 F (36.7 C) (Oral)   Resp 16   Ht 5\' 8"  (1.727 m)   Wt 83.9 kg   SpO2 100%   BMI 28.12 kg/m  Physical Exam Vitals and nursing note reviewed.  Constitutional:      General: He is not in acute distress.    Appearance: Normal appearance. He is well-developed. He is not diaphoretic.  HENT:  Head: Normocephalic.     Comments: Hematoma to the back of the scalp without overlying laceration or bleeding, no appreciable bony step off, neg battle sign     Ears:     Comments: No CSF otorrhea     Nose: Nose normal.  Eyes:     General:        Right eye: No discharge.        Left eye: No discharge.     Extraocular Movements: Extraocular movements intact.     Pupils: Pupils are equal, round, and reactive to light.  Neck:     Comments: Slight midline C-spine tenderness without step off or deformity Cardiovascular:     Rate and Rhythm: Normal rate and regular rhythm.     Pulses: Normal pulses.     Heart sounds: Normal heart sounds.  Pulmonary:     Effort: Pulmonary effort is normal. No respiratory distress.      Breath sounds: Normal breath sounds. No wheezing or rales.     Comments: Respirations equal and unlabored, patient able to speak in full sentences, lungs clear to auscultation bilaterally, no chest wall tenderness or bruising Abdominal:     General: Bowel sounds are normal. There is no distension.     Palpations: Abdomen is soft. There is no mass.     Tenderness: There is no abdominal tenderness. There is no guarding.     Comments: Abdomen soft, nondistended, nontender to palpation in all quadrants without guarding or peritoneal signs  Musculoskeletal:        General: No deformity.     Cervical back: Neck supple.     Comments: T-spine and L-spine nontender to palpation at midline. Patient moves all extremities without difficulty. All joints supple and easily movable, no erythema, swelling or palpable deformity, all compartments soft.  Skin:    General: Skin is warm and dry.     Capillary Refill: Capillary refill takes less than 2 seconds.  Neurological:     Mental Status: He is alert and oriented to person, place, and time.     Coordination: Coordination normal.     Comments: Speech is clear, able to follow commands CN III-XII intact Normal strength in upper and lower extremities bilaterally including dorsiflexion and plantar flexion, strong and equal grip strength Sensation normal to light and sharp touch Moves extremities without ataxia, coordination intact  Psychiatric:        Mood and Affect: Mood normal.        Behavior: Behavior normal.     ED Results / Procedures / Treatments   Labs (all labs ordered are listed, but only abnormal results are displayed) Labs Reviewed  CBC WITH DIFFERENTIAL/PLATELET  BASIC METABOLIC PANEL  I-STAT CHEM 8, ED    EKG EKG Interpretation  Date/Time:  Thursday September 19 2022 09:42:55 EDT Ventricular Rate:  59 PR Interval:  158 QRS Duration: 95 QT Interval:  381 QTC Calculation: 378 R Axis:   98 Text Interpretation: Sinus rhythm  Borderline right axis deviation Confirmed by Alona Bene [id number]) on 09/19/2022 9:45:29 AM  Radiology CT ANGIO HEAD NECK W WO CM  Result Date: 09/19/2022 CLINICAL DATA:  Subarachnoid hemorrhage EXAM: CT ANGIOGRAPHY HEAD AND NECK TECHNIQUE: Multidetector CT imaging of the head and neck was performed using the standard protocol during bolus administration of intravenous contrast. Multiplanar CT image reconstructions and MIPs were obtained to evaluate the vascular anatomy. Carotid stenosis measurements (when applicable) are obtained utilizing NASCET criteria, using the distal internal carotid diameter as  the denominator. RADIATION DOSE REDUCTION: This exam was performed according to the departmental dose-optimization program which includes automated exposure control, adjustment of the mA and/or kV according to patient size and/or use of iterative reconstruction technique. CONTRAST:  96mL OMNIPAQUE IOHEXOL 350 MG/ML SOLN COMPARISON:  Same-day noncontrast CT head and cervical spine FINDINGS: CTA NECK FINDINGS Aortic arch: The imaged aortic arch is normal. The origins of the major branch vessels are patent. The subclavian arteries are patent to the level imaged. Right carotid system: The right common, internal, and external carotid arteries are patent, without hemodynamically significant stenosis or occlusion. There is no evidence of dissection or aneurysm. Left carotid system: The left common, internal, and external carotid arteries are patent, without hemodynamically significant stenosis or occlusion. There is no evidence of dissection or aneurysm. Vertebral arteries: The vertebral arteries are patent, without hemodynamically significant stenosis or occlusion. There is no evidence of dissection or aneurysm. Skeleton: There is no acute osseous abnormality or suspicious osseous lesion. Other neck: The soft tissues of the neck are unremarkable. Upper chest: The imaged lung apices are clear. Review of the MIP images  confirms the above findings CTA HEAD FINDINGS Anterior circulation: The intracranial ICAs are normal. The bilateral MCAs are normal. The bilateral ACAS are normal. The anterior communicating artery is normal. There is no aneurysm or AVM. Posterior circulation: The bilateral V4 segments are normal. The basilar artery is normal. The major cerebellar arteries appear patent. The bilateral PCAs are normal. Diminutive posterior communicating arteries are seen bilaterally. There is no aneurysm or AVM. Venous sinuses: Patent. Anatomic variants: None. Review of the MIP images confirms the above findings IMPRESSION: Normal vasculature of the head and neck. Electronically Signed   By: Lesia Hausen M.D.   On: 09/19/2022 14:04   CT Head Wo Contrast  Result Date: 09/19/2022 CLINICAL DATA:  Head trauma, skull fracture or hematoma. Fall with loss of consciousness. EXAM: CT HEAD WITHOUT CONTRAST CT CERVICAL SPINE WITHOUT CONTRAST TECHNIQUE: Multidetector CT imaging of the head and cervical spine was performed following the standard protocol without intravenous contrast. Multiplanar CT image reconstructions of the cervical spine were also generated. RADIATION DOSE REDUCTION: This exam was performed according to the departmental dose-optimization program which includes automated exposure control, adjustment of the mA and/or kV according to patient size and/or use of iterative reconstruction technique. COMPARISON:  None available. FINDINGS: CT HEAD FINDINGS Brain: Small amount of subarachnoid hemorrhage along the posterior aspect of the left cingulate sulcus (axial image 23 series 1, sagittal image 37 series 6). Gray-white differentiation is preserved. No hydrocephalus or extra-axial collection. No mass effect or midline shift. Vascular: No hyperdense vessel or unexpected calcification. Skull: No calvarial fracture or suspicious bone lesion. Skull base is unremarkable. Sinuses/Orbits: Unremarkable. Other: Right posterior scalp  hematoma near the vertex. CT CERVICAL SPINE FINDINGS Alignment: Normal. Skull base and vertebrae: No acute fracture. Normal craniocervical junction. No suspicious bone lesions. Soft tissues and spinal canal: No prevertebral fluid or swelling. No visible canal hematoma. Disc levels: Mild cervical spondylosis without high-grade spinal canal stenosis. Upper chest: Unremarkable. Other: None. IMPRESSION: 1. Small amount of subarachnoid hemorrhage along the posterior aspect of the left cingulate sulcus. 2. Right posterior scalp hematoma near the vertex. No calvarial fracture. 3. No acute cervical spine fracture. Critical Value/emergent results were called by telephone at the time of interpretation on 09/19/2022 at 11:11 am to provider Dr. Jacqulyn Bath, who verbally acknowledged these results. Electronically Signed   By: Orvan Falconer M.D.   On: 09/19/2022  11:14   CT Cervical Spine Wo Contrast  Result Date: 09/19/2022 CLINICAL DATA:  Head trauma, skull fracture or hematoma. Fall with loss of consciousness. EXAM: CT HEAD WITHOUT CONTRAST CT CERVICAL SPINE WITHOUT CONTRAST TECHNIQUE: Multidetector CT imaging of the head and cervical spine was performed following the standard protocol without intravenous contrast. Multiplanar CT image reconstructions of the cervical spine were also generated. RADIATION DOSE REDUCTION: This exam was performed according to the departmental dose-optimization program which includes automated exposure control, adjustment of the mA and/or kV according to patient size and/or use of iterative reconstruction technique. COMPARISON:  None available. FINDINGS: CT HEAD FINDINGS Brain: Small amount of subarachnoid hemorrhage along the posterior aspect of the left cingulate sulcus (axial image 23 series 1, sagittal image 37 series 6). Gray-white differentiation is preserved. No hydrocephalus or extra-axial collection. No mass effect or midline shift. Vascular: No hyperdense vessel or unexpected calcification.  Skull: No calvarial fracture or suspicious bone lesion. Skull base is unremarkable. Sinuses/Orbits: Unremarkable. Other: Right posterior scalp hematoma near the vertex. CT CERVICAL SPINE FINDINGS Alignment: Normal. Skull base and vertebrae: No acute fracture. Normal craniocervical junction. No suspicious bone lesions. Soft tissues and spinal canal: No prevertebral fluid or swelling. No visible canal hematoma. Disc levels: Mild cervical spondylosis without high-grade spinal canal stenosis. Upper chest: Unremarkable. Other: None. IMPRESSION: 1. Small amount of subarachnoid hemorrhage along the posterior aspect of the left cingulate sulcus. 2. Right posterior scalp hematoma near the vertex. No calvarial fracture. 3. No acute cervical spine fracture. Critical Value/emergent results were called by telephone at the time of interpretation on 09/19/2022 at 11:11 am to provider Dr. Jacqulyn Bath, who verbally acknowledged these results. Electronically Signed   By: Orvan Falconer M.D.   On: 09/19/2022 11:14     Procedures Procedures    Medications Ordered in ED Medications  iohexol (OMNIPAQUE) 350 MG/ML injection 75 mL (75 mLs Intravenous Contrast Given 09/19/22 1333)    ED Course/ Medical Decision Making/ A&P                             Medical Decision Making Amount and/or Complexity of Data Reviewed Labs: ordered. Radiology: ordered. ECG/medicine tests: ordered.  Risk Prescription drug management.   42 y.o. male presents to the ED with complaints of fall & head injury, this involves an extensive number of treatment options, and is a complaint that carries with it a high risk of complications and morbidity.  The differential diagnosis includes head bleed, TBI, skull fracture, C-spine fracture, concussion  On arrival pt is nontoxic, vitals WNL. Exam significant for no neuro deficits  Additional history obtained from significant other at bedside. Previous records obtained and reviewed =  I ordered  medication tylenol for headache  Lab Tests:  I Ordered, reviewed, and interpreted labs, which included: No electrolyte derangements, normal blood counts  Imaging Studies ordered:  I ordered imaging studies which included CT head and C-spine, & CTA head and neck, I independently visualized and interpreted imaging which showed right posterior scalp hematoma and small subarachnoid hemorrhage in the posterior aspect of the left cingulate sulcus. CTA without evidence of aneurysm  ED Course:    I consulted Dr. Jake Samples with neurosurgery regarding subarachnoid hemorrhage and discussed lab and imaging findings, he reviewed imaging, given very small bleed and pt is neuro intact recommend DC home with outpatient follow up.  I discussed these recommendations with patient at bedside, his neuro exam has remained normal and  headache has improved. Discussed follow up and strict return precautions.    Portions of this note were generated with Scientist, clinical (histocompatibility and immunogenetics). Dictation errors may occur despite best attempts at proofreading.         Final Clinical Impression(s) / ED Diagnoses Final diagnoses:  Subarachnoid hemorrhage following injury, with loss of consciousness, initial encounter  Fall, initial encounter    Rx / DC Orders ED Discharge Orders     None         Legrand Rams 09/28/22 1049    Maia Plan, MD 09/28/22 2358

## 2022-09-19 NOTE — ED Triage Notes (Addendum)
Pt states he was on a 4 ft ladder at the top and he states he slipped and fell into the empty pool  hitting his head. Pt states had + LOC. Pt has a hematoma approx the size of a golf ball on the posterior of his head. Pt denies blood thinners. Pt c/o dizziness. Pt is A&Ox4.

## 2022-12-06 ENCOUNTER — Inpatient Hospital Stay (HOSPITAL_COMMUNITY): Payer: Medicaid Other | Admitting: Certified Registered"

## 2022-12-06 ENCOUNTER — Inpatient Hospital Stay (HOSPITAL_COMMUNITY)
Admission: EM | Admit: 2022-12-06 | Discharge: 2022-12-18 | DRG: 025 | Discharge status: Against Medical Advice | Payer: Medicaid Other | Attending: Student | Admitting: Student

## 2022-12-06 ENCOUNTER — Encounter (HOSPITAL_COMMUNITY)
Admission: EM | Discharge status: Against Medical Advice | Payer: Self-pay | Source: Home / Self Care | Attending: Internal Medicine

## 2022-12-06 ENCOUNTER — Emergency Department (HOSPITAL_COMMUNITY): Payer: Medicaid Other

## 2022-12-06 ENCOUNTER — Other Ambulatory Visit: Payer: Self-pay

## 2022-12-06 ENCOUNTER — Encounter (HOSPITAL_COMMUNITY): Payer: Self-pay

## 2022-12-06 DIAGNOSIS — F1910 Other psychoactive substance abuse, uncomplicated: Secondary | ICD-10-CM | POA: Diagnosis not present

## 2022-12-06 DIAGNOSIS — S065XAA Traumatic subdural hemorrhage with loss of consciousness status unknown, initial encounter: Secondary | ICD-10-CM | POA: Diagnosis not present

## 2022-12-06 DIAGNOSIS — G935 Compression of brain: Secondary | ICD-10-CM | POA: Diagnosis present

## 2022-12-06 DIAGNOSIS — F14921 Cocaine use, unspecified with intoxication delirium: Secondary | ICD-10-CM

## 2022-12-06 DIAGNOSIS — E440 Moderate protein-calorie malnutrition: Secondary | ICD-10-CM | POA: Diagnosis not present

## 2022-12-06 DIAGNOSIS — F17210 Nicotine dependence, cigarettes, uncomplicated: Secondary | ICD-10-CM | POA: Diagnosis present

## 2022-12-06 DIAGNOSIS — Z87820 Personal history of traumatic brain injury: Secondary | ICD-10-CM | POA: Diagnosis not present

## 2022-12-06 DIAGNOSIS — Z5329 Procedure and treatment not carried out because of patient's decision for other reasons: Secondary | ICD-10-CM | POA: Diagnosis not present

## 2022-12-06 DIAGNOSIS — D649 Anemia, unspecified: Secondary | ICD-10-CM

## 2022-12-06 DIAGNOSIS — Z781 Physical restraint status: Secondary | ICD-10-CM

## 2022-12-06 DIAGNOSIS — F411 Generalized anxiety disorder: Secondary | ICD-10-CM | POA: Diagnosis present

## 2022-12-06 DIAGNOSIS — I6201 Nontraumatic acute subdural hemorrhage: Secondary | ICD-10-CM | POA: Diagnosis present

## 2022-12-06 DIAGNOSIS — Z6823 Body mass index (BMI) 23.0-23.9, adult: Secondary | ICD-10-CM | POA: Diagnosis not present

## 2022-12-06 DIAGNOSIS — G9349 Other encephalopathy: Secondary | ICD-10-CM | POA: Diagnosis present

## 2022-12-06 DIAGNOSIS — D72829 Elevated white blood cell count, unspecified: Secondary | ICD-10-CM | POA: Diagnosis present

## 2022-12-06 DIAGNOSIS — E162 Hypoglycemia, unspecified: Secondary | ICD-10-CM | POA: Diagnosis not present

## 2022-12-06 DIAGNOSIS — F14129 Cocaine abuse with intoxication, unspecified: Secondary | ICD-10-CM | POA: Diagnosis present

## 2022-12-06 DIAGNOSIS — G936 Cerebral edema: Secondary | ICD-10-CM | POA: Diagnosis present

## 2022-12-06 DIAGNOSIS — F419 Anxiety disorder, unspecified: Secondary | ICD-10-CM | POA: Diagnosis not present

## 2022-12-06 DIAGNOSIS — Z888 Allergy status to other drugs, medicaments and biological substances status: Secondary | ICD-10-CM | POA: Diagnosis not present

## 2022-12-06 DIAGNOSIS — Z79899 Other long term (current) drug therapy: Secondary | ICD-10-CM | POA: Diagnosis not present

## 2022-12-06 DIAGNOSIS — Z820 Family history of epilepsy and other diseases of the nervous system: Secondary | ICD-10-CM

## 2022-12-06 DIAGNOSIS — F05 Delirium due to known physiological condition: Secondary | ICD-10-CM | POA: Diagnosis not present

## 2022-12-06 DIAGNOSIS — R339 Retention of urine, unspecified: Secondary | ICD-10-CM | POA: Diagnosis not present

## 2022-12-06 DIAGNOSIS — F410 Panic disorder [episodic paroxysmal anxiety] without agoraphobia: Secondary | ICD-10-CM | POA: Diagnosis present

## 2022-12-06 DIAGNOSIS — G10 Huntington's disease: Secondary | ICD-10-CM | POA: Diagnosis present

## 2022-12-06 DIAGNOSIS — Z8679 Personal history of other diseases of the circulatory system: Secondary | ICD-10-CM | POA: Diagnosis not present

## 2022-12-06 DIAGNOSIS — F1090 Alcohol use, unspecified, uncomplicated: Secondary | ICD-10-CM | POA: Diagnosis present

## 2022-12-06 DIAGNOSIS — R4182 Altered mental status, unspecified: Secondary | ICD-10-CM | POA: Diagnosis not present

## 2022-12-06 HISTORY — DX: Other psychoactive substance abuse, uncomplicated: F19.10

## 2022-12-06 HISTORY — PX: CRANIOTOMY: SHX93

## 2022-12-06 LAB — CBC
HCT: 43 % (ref 39.0–52.0)
Hemoglobin: 14.6 g/dL (ref 13.0–17.0)
MCH: 30.6 pg (ref 26.0–34.0)
MCHC: 34 g/dL (ref 30.0–36.0)
MCV: 90.1 fL (ref 80.0–100.0)
Platelets: 259 10*3/uL (ref 150–400)
RBC: 4.77 MIL/uL (ref 4.22–5.81)
RDW: 13.1 % (ref 11.5–15.5)
WBC: 12 10*3/uL — ABNORMAL HIGH (ref 4.0–10.5)
nRBC: 0 % (ref 0.0–0.2)

## 2022-12-06 LAB — POCT I-STAT 7, (LYTES, BLD GAS, ICA,H+H)
Acid-base deficit: 2 mmol/L (ref 0.0–2.0)
Bicarbonate: 21.6 mmol/L (ref 20.0–28.0)
Calcium, Ion: 1.17 mmol/L (ref 1.15–1.40)
HCT: 35 % — ABNORMAL LOW (ref 39.0–52.0)
Hemoglobin: 11.9 g/dL — ABNORMAL LOW (ref 13.0–17.0)
O2 Saturation: 100 %
Patient temperature: 35.9
Potassium: 3.7 mmol/L (ref 3.5–5.1)
Sodium: 137 mmol/L (ref 135–145)
TCO2: 23 mmol/L (ref 22–32)
pCO2 arterial: 30.2 mmHg — ABNORMAL LOW (ref 32–48)
pH, Arterial: 7.458 — ABNORMAL HIGH (ref 7.35–7.45)
pO2, Arterial: 452 mmHg — ABNORMAL HIGH (ref 83–108)

## 2022-12-06 LAB — I-STAT VENOUS BLOOD GAS, ED
Acid-Base Excess: 5 mmol/L — ABNORMAL HIGH (ref 0.0–2.0)
Bicarbonate: 27.4 mmol/L (ref 20.0–28.0)
Calcium, Ion: 1.11 mmol/L — ABNORMAL LOW (ref 1.15–1.40)
HCT: 43 % (ref 39.0–52.0)
Hemoglobin: 14.6 g/dL (ref 13.0–17.0)
O2 Saturation: 99 %
Potassium: 4.2 mmol/L (ref 3.5–5.1)
Sodium: 138 mmol/L (ref 135–145)
TCO2: 28 mmol/L (ref 22–32)
pCO2, Ven: 34.1 mmHg — ABNORMAL LOW (ref 44–60)
pH, Ven: 7.514 — ABNORMAL HIGH (ref 7.25–7.43)
pO2, Ven: 126 mmHg — ABNORMAL HIGH (ref 32–45)

## 2022-12-06 LAB — COMPREHENSIVE METABOLIC PANEL
ALT: 14 U/L (ref 0–44)
AST: 18 U/L (ref 15–41)
Albumin: 4 g/dL (ref 3.5–5.0)
Alkaline Phosphatase: 59 U/L (ref 38–126)
Anion gap: 10 (ref 5–15)
BUN: 9 mg/dL (ref 6–20)
CO2: 23 mmol/L (ref 22–32)
Calcium: 9.1 mg/dL (ref 8.9–10.3)
Chloride: 104 mmol/L (ref 98–111)
Creatinine, Ser: 0.79 mg/dL (ref 0.61–1.24)
GFR, Estimated: >60 mL/min (ref >60)
Glucose, Bld: 130 mg/dL — ABNORMAL HIGH (ref 70–99)
Potassium: 3.9 mmol/L (ref 3.5–5.1)
Sodium: 137 mmol/L (ref 135–145)
Total Bilirubin: 0.7 mg/dL (ref 0.3–1.2)
Total Protein: 6.5 g/dL (ref 6.5–8.1)

## 2022-12-06 LAB — I-STAT ARTERIAL BLOOD GAS, ED
Acid-Base Excess: 1 mmol/L (ref 0.0–2.0)
Bicarbonate: 26.2 mmol/L (ref 20.0–28.0)
Calcium, Ion: 1.22 mmol/L (ref 1.15–1.40)
HCT: 37 % — ABNORMAL LOW (ref 39.0–52.0)
Hemoglobin: 12.6 g/dL — ABNORMAL LOW (ref 13.0–17.0)
O2 Saturation: 100 %
Patient temperature: 36.8
Potassium: 3.8 mmol/L (ref 3.5–5.1)
Sodium: 141 mmol/L (ref 135–145)
TCO2: 28 mmol/L (ref 22–32)
pCO2 arterial: 43.6 mmHg (ref 32–48)
pH, Arterial: 7.386 (ref 7.35–7.45)
pO2, Arterial: 532 mmHg — ABNORMAL HIGH (ref 83–108)

## 2022-12-06 LAB — HIV ANTIBODY (ROUTINE TESTING W REFLEX): HIV Screen 4th Generation wRfx: NONREACTIVE

## 2022-12-06 LAB — RAPID URINE DRUG SCREEN, HOSP PERFORMED
Amphetamines: NOT DETECTED
Barbiturates: NOT DETECTED
Benzodiazepines: POSITIVE — AB
Cocaine: POSITIVE — AB
Opiates: NOT DETECTED
Tetrahydrocannabinol: NOT DETECTED

## 2022-12-06 LAB — URINALYSIS, ROUTINE W REFLEX MICROSCOPIC
Bilirubin Urine: NEGATIVE
Glucose, UA: NEGATIVE mg/dL
Hgb urine dipstick: NEGATIVE
Ketones, ur: NEGATIVE mg/dL
Leukocytes,Ua: NEGATIVE
Nitrite: NEGATIVE
Protein, ur: NEGATIVE mg/dL
Specific Gravity, Urine: 1.01 (ref 1.005–1.030)
pH: 6 (ref 5.0–8.0)

## 2022-12-06 LAB — CBG MONITORING, ED: Glucose-Capillary: 120 mg/dL — ABNORMAL HIGH (ref 70–99)

## 2022-12-06 LAB — HEMOGLOBIN A1C
Hgb A1c MFr Bld: 5.1 % (ref 4.8–5.6)
Mean Plasma Glucose: 99.67 mg/dL

## 2022-12-06 LAB — ACETAMINOPHEN LEVEL: Acetaminophen (Tylenol), Serum: <10 ug/mL — ABNORMAL LOW (ref 10–30)

## 2022-12-06 LAB — ETHANOL: Alcohol, Ethyl (B): <10 mg/dL (ref <10)

## 2022-12-06 LAB — AMMONIA: Ammonia: 36 umol/L — ABNORMAL HIGH (ref 9–35)

## 2022-12-06 LAB — CK: Total CK: 72 U/L (ref 49–397)

## 2022-12-06 LAB — MRSA NEXT GEN BY PCR, NASAL: MRSA by PCR Next Gen: NOT DETECTED

## 2022-12-06 LAB — SALICYLATE LEVEL: Salicylate Lvl: <7 mg/dL — ABNORMAL LOW (ref 7.0–30.0)

## 2022-12-06 LAB — GLUCOSE, CAPILLARY
Glucose-Capillary: 116 mg/dL — ABNORMAL HIGH (ref 70–99)
Glucose-Capillary: 117 mg/dL — ABNORMAL HIGH (ref 70–99)

## 2022-12-06 SURGERY — CRANIOTOMY HEMATOMA EVACUATION SUBDURAL
Anesthesia: General | Site: Head | Laterality: Left

## 2022-12-06 MED ORDER — MANNITOL 25 % IV SOLN: 25.0000 g | Freq: Once | INTRAVENOUS | Status: DC

## 2022-12-06 MED ORDER — THROMBIN 20000 UNITS EX SOLR
CUTANEOUS | Status: DC | PRN
  Administered 2022-12-06: 20 mL via TOPICAL

## 2022-12-06 MED ORDER — DIAZEPAM 5 MG/ML IJ SOLN
10.0000 mg | Freq: Once | INTRAMUSCULAR | Status: AC
  Administered 2022-12-06: 10 mg via INTRAVENOUS
  Filled 2022-12-06: qty 2

## 2022-12-06 MED ORDER — THROMBIN 5000 UNITS EX SOLR
OROMUCOSAL | Status: DC | PRN
  Administered 2022-12-06: 5 mL via TOPICAL

## 2022-12-06 MED ORDER — FAMOTIDINE 20 MG PO TABS
20.0000 mg | ORAL_TABLET | Freq: Two times a day (BID) | ORAL | Status: DC
  Administered 2022-12-06 – 2022-12-07 (×3): 20 mg
  Filled 2022-12-06 (×3): qty 1

## 2022-12-06 MED ORDER — FENTANYL 2500MCG IN NS 250ML (10MCG/ML) PREMIX INFUSION
50.0000 ug/h | INTRAVENOUS | Status: DC
  Administered 2022-12-06: 100 ug/h via INTRAVENOUS
  Administered 2022-12-07: 200 ug/h via INTRAVENOUS
  Administered 2022-12-07: 75 ug/h via INTRAVENOUS
  Administered 2022-12-08: 200 ug/h via INTRAVENOUS
  Filled 2022-12-06 (×4): qty 250

## 2022-12-06 MED ORDER — CEFAZOLIN SODIUM-DEXTROSE 2-3 GM-%(50ML) IV SOLR
INTRAVENOUS | Status: DC | PRN
  Administered 2022-12-06: 2 g via INTRAVENOUS

## 2022-12-06 MED ORDER — ONDANSETRON HCL 4 MG/2ML IJ SOLN: 4.0000 mg | INTRAMUSCULAR | Status: DC | PRN

## 2022-12-06 MED ORDER — PROMETHAZINE HCL 25 MG PO TABS: 12.5000 mg | ORAL_TABLET | ORAL | Status: DC | PRN

## 2022-12-06 MED ORDER — LEVETIRACETAM IN NACL 500 MG/100ML IV SOLN
500.0000 mg | Freq: Two times a day (BID) | INTRAVENOUS | Status: AC
  Administered 2022-12-07 – 2022-12-13 (×14): 500 mg via INTRAVENOUS
  Filled 2022-12-06 (×14): qty 100

## 2022-12-06 MED ORDER — EPHEDRINE SULFATE-NACL 50-0.9 MG/10ML-% IV SOSY
PREFILLED_SYRINGE | INTRAVENOUS | Status: DC | PRN
  Administered 2022-12-06: 5 mg via INTRAVENOUS

## 2022-12-06 MED ORDER — DIAZEPAM 5 MG/ML IJ SOLN: 10.0000 mg | Freq: Once | INTRAMUSCULAR | Status: AC

## 2022-12-06 MED ORDER — ONDANSETRON HCL 4 MG/2ML IJ SOLN
INTRAMUSCULAR | Status: DC | PRN
  Administered 2022-12-06: 4 mg via INTRAVENOUS

## 2022-12-06 MED ORDER — EPHEDRINE 5 MG/ML INJ
INTRAVENOUS | Status: AC
  Filled 2022-12-06: qty 5

## 2022-12-06 MED ORDER — DEXAMETHASONE SODIUM PHOSPHATE 10 MG/ML IJ SOLN
INTRAMUSCULAR | Status: DC | PRN
  Administered 2022-12-06: 10 mg via INTRAVENOUS

## 2022-12-06 MED ORDER — ONDANSETRON HCL 4 MG/2ML IJ SOLN
INTRAMUSCULAR | Status: AC
  Filled 2022-12-06: qty 2

## 2022-12-06 MED ORDER — SODIUM CHLORIDE 0.9 % IV SOLN
0.1500 ug/kg/min | INTRAVENOUS | Status: AC
  Administered 2022-12-06: .2 ug/kg/min via INTRAVENOUS
  Filled 2022-12-06: qty 2000

## 2022-12-06 MED ORDER — ONDANSETRON HCL 4 MG PO TABS: 4.0000 mg | ORAL_TABLET | ORAL | Status: DC | PRN

## 2022-12-06 MED ORDER — SODIUM CHLORIDE 0.9 % IV BOLUS (SEPSIS)
1000.0000 mL | Freq: Once | INTRAVENOUS | Status: AC
  Administered 2022-12-06: 1000 mL via INTRAVENOUS

## 2022-12-06 MED ORDER — POLYETHYLENE GLYCOL 3350 17 G PO PACK: 17.0000 g | PACK | Freq: Every day | ORAL | Status: DC | PRN

## 2022-12-06 MED ORDER — NICARDIPINE HCL IN NACL 20-0.86 MG/200ML-% IV SOLN: 3.0000 mg/h | INTRAVENOUS | Status: DC

## 2022-12-06 MED ORDER — CLEVIDIPINE BUTYRATE 0.5 MG/ML IV EMUL
INTRAVENOUS | Status: AC
  Filled 2022-12-06: qty 50

## 2022-12-06 MED ORDER — SODIUM CHLORIDE 0.9 % IV SOLN: INTRAVENOUS | Status: DC | PRN

## 2022-12-06 MED ORDER — LIDOCAINE-EPINEPHRINE 1 %-1:100000 IJ SOLN
INTRAMUSCULAR | Status: AC
  Filled 2022-12-06: qty 1

## 2022-12-06 MED ORDER — ORAL CARE MOUTH RINSE
15.0000 mL | OROMUCOSAL | Status: DC
  Administered 2022-12-06 – 2022-12-08 (×21): 15 mL via OROMUCOSAL

## 2022-12-06 MED ORDER — SODIUM CHLORIDE 0.9 % IV SOLN
1000.0000 mL | INTRAVENOUS | Status: DC
  Administered 2022-12-06 – 2022-12-09 (×5): 1000 mL via INTRAVENOUS

## 2022-12-06 MED ORDER — INSULIN ASPART 100 UNIT/ML IJ SOLN
0.0000 [IU] | INTRAMUSCULAR | Status: DC
  Administered 2022-12-07 – 2022-12-08 (×3): 1 [IU] via SUBCUTANEOUS
  Administered 2022-12-10: 2 [IU] via SUBCUTANEOUS
  Administered 2022-12-10 – 2022-12-11 (×2): 1 [IU] via SUBCUTANEOUS

## 2022-12-06 MED ORDER — PHENYLEPHRINE 80 MCG/ML (10ML) SYRINGE FOR IV PUSH (FOR BLOOD PRESSURE SUPPORT)
PREFILLED_SYRINGE | INTRAVENOUS | Status: AC
  Filled 2022-12-06: qty 10

## 2022-12-06 MED ORDER — ORAL CARE MOUTH RINSE: 15.0000 mL | OROMUCOSAL | Status: DC | PRN

## 2022-12-06 MED ORDER — LIDOCAINE-EPINEPHRINE 1 %-1:100000 IJ SOLN
INTRAMUSCULAR | Status: DC | PRN
  Administered 2022-12-06: 8 mL

## 2022-12-06 MED ORDER — FENTANYL CITRATE (PF) 250 MCG/5ML IJ SOLN
INTRAMUSCULAR | Status: AC
  Filled 2022-12-06: qty 5

## 2022-12-06 MED ORDER — PROPOFOL 1000 MG/100ML IV EMUL
INTRAVENOUS | Status: AC
  Filled 2022-12-06: qty 100

## 2022-12-06 MED ORDER — CHLORHEXIDINE GLUCONATE CLOTH 2 % EX PADS
6.0000 | MEDICATED_PAD | Freq: Every day | CUTANEOUS | Status: DC
  Administered 2022-12-06 – 2022-12-07 (×2): 6 via TOPICAL

## 2022-12-06 MED ORDER — CEFAZOLIN SODIUM-DEXTROSE 1-4 GM/50ML-% IV SOLN
1.0000 g | Freq: Three times a day (TID) | INTRAVENOUS | Status: AC
  Administered 2022-12-06 – 2022-12-08 (×6): 1 g via INTRAVENOUS
  Filled 2022-12-06 (×6): qty 50

## 2022-12-06 MED ORDER — 0.9 % SODIUM CHLORIDE (POUR BTL) OPTIME
TOPICAL | Status: DC | PRN
  Administered 2022-12-06: 3000 mL

## 2022-12-06 MED ORDER — ETOMIDATE 2 MG/ML IV SOLN
INTRAVENOUS | Status: AC | PRN
  Administered 2022-12-06: 20 mg via INTRAVENOUS

## 2022-12-06 MED ORDER — THROMBIN 5000 UNITS EX SOLR
CUTANEOUS | Status: AC
  Filled 2022-12-06: qty 5000

## 2022-12-06 MED ORDER — LABETALOL HCL 5 MG/ML IV SOLN
10.0000 mg | INTRAVENOUS | Status: DC | PRN
  Administered 2022-12-09 – 2022-12-16 (×3): 10 mg via INTRAVENOUS
  Filled 2022-12-06 (×4): qty 4

## 2022-12-06 MED ORDER — DOCUSATE SODIUM 100 MG PO CAPS: 100.0000 mg | ORAL_CAPSULE | Freq: Two times a day (BID) | ORAL | Status: DC | PRN

## 2022-12-06 MED ORDER — MANNITOL 20 % IV SOLN
25.0000 g | Freq: Once | INTRAVENOUS | Status: AC
  Administered 2022-12-06: 100 g via INTRAVENOUS
  Filled 2022-12-06: qty 250

## 2022-12-06 MED ORDER — DIAZEPAM 5 MG/ML IJ SOLN
INTRAMUSCULAR | Status: AC
  Administered 2022-12-06: 10 mg via INTRAVENOUS
  Filled 2022-12-06: qty 2

## 2022-12-06 MED ORDER — ROCURONIUM BROMIDE 10 MG/ML (PF) SYRINGE
PREFILLED_SYRINGE | INTRAVENOUS | Status: AC
  Filled 2022-12-06: qty 10

## 2022-12-06 MED ORDER — CEFAZOLIN SODIUM 1 G IJ SOLR
INTRAMUSCULAR | Status: AC
  Filled 2022-12-06: qty 20

## 2022-12-06 MED ORDER — LACTATED RINGERS IV SOLN: INTRAVENOUS | Status: DC

## 2022-12-06 MED ORDER — PANTOPRAZOLE SODIUM 40 MG IV SOLR
40.0000 mg | Freq: Every day | INTRAVENOUS | Status: DC
  Administered 2022-12-06 – 2022-12-12 (×7): 40 mg via INTRAVENOUS
  Filled 2022-12-06 (×8): qty 10

## 2022-12-06 MED ORDER — SODIUM CHLORIDE 0.9 % IV SOLN
0.1500 ug/kg/min | INTRAVENOUS | Status: DC
  Filled 2022-12-06 (×9): qty 2000

## 2022-12-06 MED ORDER — PROPOFOL 1000 MG/100ML IV EMUL
5.0000 ug/kg/min | INTRAVENOUS | Status: DC
  Administered 2022-12-06: 20 ug/kg/min via INTRAVENOUS
  Administered 2022-12-07: 60 ug/kg/min via INTRAVENOUS
  Administered 2022-12-07: 80 ug/kg/min via INTRAVENOUS
  Administered 2022-12-07: 60 ug/kg/min via INTRAVENOUS
  Administered 2022-12-07: 25 ug/kg/min via INTRAVENOUS
  Administered 2022-12-07: 50 ug/kg/min via INTRAVENOUS
  Administered 2022-12-08: 70 ug/kg/min via INTRAVENOUS
  Administered 2022-12-08: 50 ug/kg/min via INTRAVENOUS
  Filled 2022-12-06 (×3): qty 100
  Filled 2022-12-06 (×2): qty 200
  Filled 2022-12-06 (×2): qty 100

## 2022-12-06 MED ORDER — SUCCINYLCHOLINE CHLORIDE 20 MG/ML IJ SOLN
INTRAMUSCULAR | Status: AC | PRN
  Administered 2022-12-06: 150 mg via INTRAVENOUS

## 2022-12-06 MED ORDER — ENALAPRILAT 1.25 MG/ML IV SOLN: 1.2500 mg | Freq: Four times a day (QID) | INTRAVENOUS | Status: DC | PRN

## 2022-12-06 MED ORDER — ROCURONIUM BROMIDE 10 MG/ML (PF) SYRINGE
PREFILLED_SYRINGE | INTRAVENOUS | Status: DC | PRN
  Administered 2022-12-06: 100 mg via INTRAVENOUS

## 2022-12-06 MED ORDER — FENTANYL BOLUS VIA INFUSION
50.0000 ug | INTRAVENOUS | Status: DC | PRN
  Administered 2022-12-07 (×4): 100 ug via INTRAVENOUS

## 2022-12-06 MED ORDER — DOCUSATE SODIUM 50 MG/5ML PO LIQD: 100.0000 mg | Freq: Two times a day (BID) | ORAL | Status: DC | PRN

## 2022-12-06 MED ORDER — LEVETIRACETAM IN NACL 500 MG/100ML IV SOLN: 500.0000 mg | Freq: Two times a day (BID) | INTRAVENOUS | Status: DC

## 2022-12-06 MED ORDER — THROMBIN 20000 UNITS EX SOLR
CUTANEOUS | Status: AC
  Filled 2022-12-06: qty 20000

## 2022-12-06 SURGICAL SUPPLY — 87 items
ADH SKN CLS APL DERMABOND .7 (GAUZE/BANDAGES/DRESSINGS) ×4
APL SKNCLS STERI-STRIP NONHPOA (GAUZE/BANDAGES/DRESSINGS) ×4
BAG COUNTER SPONGE SURGICOUNT (BAG) ×6 IMPLANT
BAG DRN CSF CATH SYS STRL MNTR (MISCELLANEOUS)
BAG SPNG CNTER NS LX DISP (BAG) ×4
BENZOIN TINCTURE PRP APPL 2/3 (GAUZE/BANDAGES/DRESSINGS) ×6 IMPLANT
BLADE CLIPPER SURG (BLADE) ×6 IMPLANT
BNDG GAUZE DERMACEA FLUFF 4 (GAUZE/BANDAGES/DRESSINGS) IMPLANT
BNDG GZE DERMACEA 4 6PLY (GAUZE/BANDAGES/DRESSINGS)
BRR ADH 6X5 SEPRAFILM 1 SHT (MISCELLANEOUS)
BUR ACORN 9.0 PRECISION (BURR) ×3 IMPLANT
BUR SPIRAL ROUTER 2.3 (BUR) ×5 IMPLANT
CANISTER SUCT 3000ML PPV (MISCELLANEOUS) ×6 IMPLANT
CATH VENTRIC 35X38 W/TROCAR LG (CATHETERS) ×1 IMPLANT
CLIP TI MEDIUM 6 (CLIP) IMPLANT
COVER BURR HOLE UNIV 10 (Orthopedic Implant) ×2 IMPLANT
DERMABOND ADVANCED .7 DNX12 (GAUZE/BANDAGES/DRESSINGS) ×6 IMPLANT
DRAPE MICROSCOPE SLANT 54X150 (MISCELLANEOUS) IMPLANT
DRAPE NEUROLOGICAL W/INCISE (DRAPES) ×6 IMPLANT
DRAPE SHEET LG 3/4 BI-LAMINATE (DRAPES) ×6 IMPLANT
DRAPE WARM FLUID 44X44 (DRAPES) ×5 IMPLANT
DRESSING MEPILEX FLEX 4X4 (GAUZE/BANDAGES/DRESSINGS) IMPLANT
DRSG AQUACEL AG ADV 3.5X 6 (GAUZE/BANDAGES/DRESSINGS) ×4 IMPLANT
DRSG AQUACEL AG ADV 3.5X10 (GAUZE/BANDAGES/DRESSINGS) ×1 IMPLANT
DRSG MEPILEX FLEX 4X4 (GAUZE/BANDAGES/DRESSINGS)
DRSG XEROFORM 1X8 (GAUZE/BANDAGES/DRESSINGS) IMPLANT
DURAPREP 26ML APPLICATOR (WOUND CARE) ×6 IMPLANT
ELECT COATED BLADE 2.86 ST (ELECTRODE) ×6 IMPLANT
ELECT REM PT RETURN 9FT ADLT (ELECTROSURGICAL) ×4
ELECTRODE REM PT RTRN 9FT ADLT (ELECTROSURGICAL) ×6 IMPLANT
EVACUATOR 1/8 PVC DRAIN (DRAIN) IMPLANT
EVACUATOR SILICONE 100CC (DRAIN) IMPLANT
FORCEPS BIPOLAR SPETZLER 8 1.0 (NEUROSURGERY SUPPLIES) ×5 IMPLANT
GAUZE 4X4 16PLY ~~LOC~~+RFID DBL (SPONGE) IMPLANT
GAUZE SPONGE 4X4 12PLY STRL (GAUZE/BANDAGES/DRESSINGS) ×6 IMPLANT
GLOVE BIO SURGEON STRL SZ7 (GLOVE) ×12 IMPLANT
GLOVE BIOGEL PI IND STRL 7.5 (GLOVE) ×18 IMPLANT
GLOVE ECLIPSE 7.5 STRL STRAW (GLOVE) ×6 IMPLANT
GOWN STRL REUS W/ TWL LRG LVL3 (GOWN DISPOSABLE) ×12 IMPLANT
GOWN STRL REUS W/ TWL XL LVL3 (GOWN DISPOSABLE) ×6 IMPLANT
GOWN STRL REUS W/TWL 2XL LVL3 (GOWN DISPOSABLE) IMPLANT
GOWN STRL REUS W/TWL LRG LVL3 (GOWN DISPOSABLE) ×8
GOWN STRL REUS W/TWL XL LVL3 (GOWN DISPOSABLE) ×4
GRAFT DURAGEN MATRIX 3WX3L (Graft) ×2 IMPLANT
GRAFT DURAGEN MATRIX 3X3 SNGL (Graft) ×1 IMPLANT
HEMOSTAT POWDER KIT SURGIFOAM (HEMOSTASIS) ×5 IMPLANT
HEMOSTAT SURGICEL 2X14 (HEMOSTASIS) ×4 IMPLANT
HOOK RETRACTION 12 ELAST STAY (MISCELLANEOUS) IMPLANT
KIT BASIN OR (CUSTOM PROCEDURE TRAY) ×6 IMPLANT
KIT TURNOVER KIT B (KITS) ×6 IMPLANT
NDL HYPO 22X1.5 SAFETY MO (MISCELLANEOUS) ×4 IMPLANT
NEEDLE HYPO 22X1.5 SAFETY MO (MISCELLANEOUS) ×4 IMPLANT
NS IRRIG 1000ML POUR BTL (IV SOLUTION) ×18 IMPLANT
PACK CRANIOTOMY CUSTOM (CUSTOM PROCEDURE TRAY) ×5 IMPLANT
PATTIES SURGICAL .5 X.5 (GAUZE/BANDAGES/DRESSINGS) IMPLANT
PATTIES SURGICAL .5 X3 (DISPOSABLE) IMPLANT
PATTIES SURGICAL 1X1 (DISPOSABLE) IMPLANT
PERFORATOR LRG 14-11MM (BIT) ×2 IMPLANT
PLATE CRANIAL 12 2H RIGID UNI (Plate) ×2 IMPLANT
SCREW UNIII AXS SD 1.5X4 (Screw) ×2 IMPLANT
SEPRAFILM MEMBRANE 5X6 (MISCELLANEOUS) IMPLANT
SOL ELECTROSURG ANTI STICK (MISCELLANEOUS) ×2
SOLUTION ELECTROSURG ANTI STCK (MISCELLANEOUS) ×5 IMPLANT
SPONGE NEURO XRAY DETECT 1X3 (DISPOSABLE) IMPLANT
SPONGE SURGIFOAM ABS GEL 100 (HEMOSTASIS) ×6 IMPLANT
SPONGE T-LAP 18X18 ~~LOC~~+RFID (SPONGE) ×6 IMPLANT
STAPLER VISISTAT 35W (STAPLE) ×12 IMPLANT
STOCKINETTE 6 STRL (DRAPES) ×6 IMPLANT
STRIP CLOSURE SKIN 1/2X4 (GAUZE/BANDAGES/DRESSINGS) ×6 IMPLANT
SUT ETHILON 3 0 FSL (SUTURE) IMPLANT
SUT ETHILON 3 0 PS 1 (SUTURE) IMPLANT
SUT MON AB 3-0 SH 27 (SUTURE)
SUT MON AB 3-0 SH27 (SUTURE) IMPLANT
SUT NURALON 4 0 TR CR/8 (SUTURE) IMPLANT
SUT VIC AB 2-0 CP2 18 (SUTURE) ×10 IMPLANT
SUT VIC AB 3-0 SH 8-18 (SUTURE) IMPLANT
SUT VICRYL RAPIDE 3 0 (SUTURE) IMPLANT
SYSTEM CSF EXTERNAL DRAINAGE (MISCELLANEOUS) IMPLANT
SYSTEM LIMITORR VOL LMT 20ML (CATHETERS) ×1 IMPLANT
TAPE PAPER 1X10 WHT MICROPORE (GAUZE/BANDAGES/DRESSINGS) ×6 IMPLANT
TAPE PAPER 2X10 WHT MICROPORE (GAUZE/BANDAGES/DRESSINGS) ×6 IMPLANT
TOWEL GREEN STERILE (TOWEL DISPOSABLE) ×6 IMPLANT
TOWEL GREEN STERILE FF (TOWEL DISPOSABLE) ×6 IMPLANT
TRAY FOLEY MTR SLVR 16FR STAT (SET/KITS/TRAYS/PACK) ×6 IMPLANT
TUBE CONNECTING 20X1/4 (TUBING) ×6 IMPLANT
TUBING FEATHERFLOW (TUBING) IMPLANT
WATER STERILE IRR 1000ML POUR (IV SOLUTION) ×6 IMPLANT

## 2022-12-06 NOTE — ED Notes (Signed)
Pt is very confused at this time. Ripped off VS equipment and has attempted to get out of bed multiple times. Pt redirected by staff multiple times with no success. Dr.Trifan at bedside this time. Ordered soft restraints to bilateral wrist. Family at bedside. Will continue to monitor.

## 2022-12-06 NOTE — Progress Notes (Signed)
RT transported pt from ED trauma C to CT and back without any complications. RN at bedside.

## 2022-12-06 NOTE — Transfer of Care (Signed)
Immediate Anesthesia Transfer of Care Note  Patient: Joshua Bishop  Procedure(s) Performed: CRANIOTOMY HEMATOMA EVACUATION SUBDURAL (Left: Head)  Patient Location: ICU  Anesthesia Type:General  Level of Consciousness: Patient remains intubated per anesthesia plan  Airway & Oxygen Therapy: Patient remains intubated per anesthesia plan and Patient placed on Ventilator (see vital sign flow sheet for setting)  Post-op Assessment: Report given to RN and Post -op Vital signs reviewed and stable  Post vital signs: Reviewed and stable  Last Vitals:  Vitals Value Taken Time  BP 135/58 12/06/22 2034  Temp 36.1 C 12/06/22 2042  Pulse 57 12/06/22 2042  Resp 16 12/06/22 2041  SpO2 100 % 12/06/22 2042  Vitals shown include unvalidated device data.  Last Pain:  Vitals:   12/06/22 1434  TempSrc: Rectal         Complications: No notable events documented.
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
## 2022-12-06 NOTE — Anesthesia Procedure Notes (Signed)
Arterial Line Insertion Start/End6/21/2024 6:54 PM, 12/06/2022 7:02 PM Performed by: Val Eagle, MD, anesthesiologist  Patient location: OR. Preanesthetic checklist: patient identified, IV checked and monitors and equipment checked Emergency situation Right, radial was placed Catheter size: 20 G Hand hygiene performed  and maximum sterile barriers used   Attempts: 1 Procedure performed without using ultrasound guided technique. Following insertion, dressing applied and Biopatch. Post procedure assessment: normal and unchanged  Post procedure complications: unsuccessful attempts and second provider assisted. Patient tolerated the procedure well with no immediate complications.

## 2022-12-06 NOTE — ED Triage Notes (Signed)
Mother picked him up from mothers house and brought him to fire station. Pt has be confused and aphasic for three days. Pt was picked up from mothers house to go to work in [employer] three days ago but has been having gait issues .  Pt has been taking large amounts of tylenol for headaches. Family history of huntington's  Cbg 140  Pressure 130/68 Hr 84 nsr Not following commands. Responds to stimuli.  20g L forearm  History of drug use.

## 2022-12-06 NOTE — Anesthesia Preprocedure Evaluation (Signed)
Anesthesia Evaluation  Patient identified by MRN, date of birth, ID band Patient unresponsive    Reviewed: Allergy & Precautions, Patient's Chart, lab work & pertinent test results, Unable to perform ROS - Chart review onlyPreop documentation limited or incomplete due to emergent nature of procedure.  History of Anesthesia Complications Negative for: history of anesthetic complications  Airway Mallampati: Intubated       Dental   Pulmonary Current Smoker   breath sounds clear to auscultation       Cardiovascular  Rhythm:Regular     Neuro/Psych   Anxiety     subdural CVA    GI/Hepatic ,,,(+)     substance abuse  cocaine use  Endo/Other    Renal/GU      Musculoskeletal   Abdominal   Peds  Hematology  (+) Blood dyscrasia, anemia Lab Results      Component                Value               Date                      WBC                      12.0 (H)            12/06/2022                HGB                      12.6 (L)            12/06/2022                HCT                      37.0 (L)            12/06/2022                MCV                      90.1                12/06/2022                PLT                      259                 12/06/2022              Anesthesia Other Findings   Reproductive/Obstetrics                             Anesthesia Physical Anesthesia Plan  ASA: 5 and emergent  Anesthesia Plan: General   Post-op Pain Management:    Induction: Intravenous  PONV Risk Score and Plan: 1  Airway Management Planned: Oral ETT  Additional Equipment: Arterial line  Intra-op Plan:   Post-operative Plan:   Informed Consent:      History available from chart only and Only emergency history available  Plan Discussed with: CRNA and Surgeon  Anesthesia Plan Comments:        Anesthesia Quick Evaluation

## 2022-12-06 NOTE — H&P (Signed)
NAME:  Joshua Bishop, MRN:  [id number], DOB:  [birth year], LOS: 0 ADMISSION DATE:  12/06/2022, CONSULTATION DATE: 12/06/2022 REFERRING MD:  Linwood Dibbles, MD , CHIEF COMPLAINT: Altered mental status  History of Present Illness:  Patient is intubated and sedated, most of the history is taken from chart review and from patient's mother.  42 year old male with history of anxiety/panic attacks, recent admission for traumatic subarachnoid hemorrhage in April 2024 who was brought into the emergency department by her mother due to altered mental status for last 2 to 3 days.  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  Patient and mother urged him to go to hospital but he was refusing today he was noted to be severely altered with decreased responsiveness, his mother called EMS and he was brought to the emergency department. In the ED patient was noted to be agitated, restless, trying to take out his IV lines, he was sent for CT head which was initially unable to be completed because of restlessness and agitation, he was given Valium 10 mg without much help, he was intubated for airway protection, head CT showed large frontoparietal left-sided subacute subdural hematoma with severe cerebral edema and left-sided uncal herniation.  Neurosurgery was consulted, patient is going to OR for evacuation of subdural hematoma and possible bur holes.  PCCM was consulted for help evaluation medical management and admission to ICU  Of note patient has family history of Huntington's chorea, his father died from this illness  Pertinent  Medical History   Past Medical History:  Diagnosis Date   Panic attack      Significant Hospital Events: Including procedures, antibiotic start and stop dates in addition to other pertinent events     Interim History / Subjective:  As above  Objective   Blood pressure 105/71, pulse  (!) 53, temperature 98.3 F (36.8 C), resp. rate 16, height 5\' 10"  (1.778 m), weight 74.8 kg, SpO2 100 %.    Vent Mode: PRVC FiO2 (%):  [50 %-100 %] 50 % Set Rate:  [16 bmp] 16 bmp Vt Set:  [580 mL] 580 mL PEEP:  [5 cmH20] 5 cmH20 Plateau Pressure:  [12 cmH20] 12 cmH20   Intake/Output Summary (Last 24 hours) at 12/06/2022 1837 Last data filed at 12/06/2022 1635 Gross per 24 hour  Intake 1000 ml  Output --  Net 1000 ml   Filed Weights   12/06/22 1800  Weight: 74.8 kg    Examination:   Physical exam: General: Crtitically ill-appearing young male, orally intubated HEENT: Bruni/AT, eyes anicteric.  ETT and OGT in place Neuro: Sedated, not following commands.  Eyes are closed.  Pupils 3 mm bilateral minimally reactive to light, absent gag and cough, triggering ventilator Chest: Coarse breath sounds, no wheezes or rhonchi Heart: Regular rate and rhythm, no murmurs or gallops Abdomen: Soft, nondistended, bowel sounds present Skin: No rash.  Multiple tattoo marks noted  Labs and images were reviewed  Resolved Hospital Problem list     Assessment & Plan:  Left large subacute subdural hematoma Cerebral edema with brain compression, midline shift of 18 mm Left-sided uncal herniation Admit to ICU Neurosurgery is following, plan to take him to the OR for craniotomy with subdural hematoma evacuation and possible bur holes Continue neuro watch every 2 hours He is getting mannitol now Keep head end of the bed elevated between 30-45 degrees, in neutral position  Avoid fever and seizure Continue Keppra 500 mg twice daily for seizure prophylaxis as subdural hematoma poses a high risk of seizures Repeat imaging deferred to neurosurgery Patient may need 3% hypertonic saline postprocedure if he continued to have severe cerebral edema  Acute respiratory insufficiency Acute encephalopathy due to subdural hematoma Patient was unable to protect his airway in the setting of altered mental status  polysubstance abuse He was intubated Continue lung protective ventilation VAP prevention bundle in place PAD protocol with propofol and fentanyl with RASS goal -1  Polysubstance abuse Patient's UDS is positive for cocaine and benzos Watch for signs of withdrawal  Best Practice (right click and "Reselect all SmartList Selections" daily)   Diet/type: NPO DVT prophylaxis: SCD GI prophylaxis: H2B Lines: N/A Foley:  Yes, and it is still needed Code Status:  full code Last date of multidisciplinary goals of care discussion [6/21: Patient mother was updated at bedside, decision was to continue full scope of care]  Labs   CBC: Recent Labs  Lab 12/06/22 1433 12/06/22 1618 12/06/22 1755  WBC 12.0*  --   --   HGB 14.6 14.6 12.6*  HCT 43.0 43.0 37.0*  MCV 90.1  --   --   PLT 259  --   --     Basic Metabolic Panel: Recent Labs  Lab 12/06/22 1433 12/06/22 1618 12/06/22 1755  NA 137 138 141  K 3.9 4.2 3.8  CL 104  --   --   CO2 23  --   --   GLUCOSE 130*  --   --   BUN 9  --   --   CREATININE 0.79  --   --   CALCIUM 9.1  --   --    GFR: Estimated Creatinine Clearance: 126.7 mL/min (by C-G formula based on SCr of 0.79 mg/dL). Recent Labs  Lab 12/06/22 1433  WBC 12.0*    Liver Function Tests: Recent Labs  Lab 12/06/22 1433  AST 18  ALT 14  ALKPHOS 59  BILITOT 0.7  PROT 6.5  ALBUMIN 4.0   No results for input(s): "LIPASE", "AMYLASE" in the last 168 hours. Recent Labs  Lab 12/06/22 1523  AMMONIA 36*    ABG    Component Value Date/Time   PHART 7.386 12/06/2022 1755   PCO2ART 43.6 12/06/2022 1755   PO2ART 532 (H) 12/06/2022 1755   HCO3 26.2 12/06/2022 1755   TCO2 28 12/06/2022 1755   O2SAT 100 12/06/2022 1755     Coagulation Profile: No results for input(s): "INR", "PROTIME" in the last 168 hours.  Cardiac Enzymes: Recent Labs  Lab 12/06/22 1523  CKTOTAL 72    HbA1C: No results found for: "HGBA1C"  CBG: Recent Labs  Lab 12/06/22 1441   GLUCAP 120*    Review of Systems:   Unable to obtain as patient is intubated and sedated  Past Medical History:  He,  has a past medical history of Panic attack.   Surgical History:  History reviewed. No pertinent surgical history.   Social History:   reports that he has been smoking cigarettes. He has been smoking an average of .5 packs per day. He has never used smokeless tobacco. He reports current alcohol use. He reports that he does not use drugs.   Family History:  His family history includes Cancer in an other family member.   Allergies Allergies  Allergen Reactions   Zidovudine     `     Home Medications  Prior to Admission medications  Medication Sig Start Date End Date Taking? Authorizing Provider  acetaminophen (TYLENOL) 500 MG tablet Take 1,000 mg by mouth every 6 (six) hours as needed for pain.    [provider]  cephALEXin (KEFLEX) 500 MG capsule Take 1 capsule (500 mg total) by mouth 3 (three) times daily. 12/19/21   Triplett, Tammy, PA-C  hydrOXYzine (ATARAX/VISTARIL) 25 MG tablet Take 1 tablet (25 mg total) by mouth every 6 (six) hours as needed for anxiety. Patient not taking: Reported on 12/19/2021 11/28/19   Gilda Crease, MD  ibuprofen (ADVIL,MOTRIN) 200 MG tablet Take 400 mg by mouth every 6 (six) hours as needed.    [provider]  predniSONE (STERAPRED UNI-PAK 21 TAB) 10 MG (21) TBPK tablet Take by mouth daily. Take 6 tabs by mouth daily  for 2 days, then 5 tabs for 2 days, then 4 tabs for 2 days, then 3 tabs for 2 days, 2 tabs for 2 days, then 1 tab by mouth daily for 2 days 01/21/22   Prosperi, Christian H, PA-C     Critical care time:     The patient is critically ill due to subdural hematoma with cerebral edema and brain herniation.  Critical care was necessary to treat or prevent imminent or life-threatening deterioration.  Critical care was time spent personally by me on the following activities: development of treatment  plan with patient and/or surrogate as well as nursing, discussions with consultants, evaluation of patient's response to treatment, examination of patient, obtaining history from patient or surrogate, ordering and performing treatments and interventions, ordering and review of laboratory studies, ordering and review of radiographic studies, pulse oximetry, re-evaluation of patient's condition and participation in multidisciplinary rounds.   During this encounter critical care time was devoted to patient care services described in this note for 41 minutes.     Cheri Fowler, MD [facility] See Amion for pager If no response to pager, please call [phone] until 7pm After 7pm, Please call E-link [phone]

## 2022-12-06 NOTE — ED Notes (Signed)
Preparing for intubation. Dr.Knapp, RT, Pharmacist and Liz,RN at bedside.

## 2022-12-06 NOTE — Progress Notes (Signed)
eLink Physician-Brief Progress Note Patient Name: Joshua Bishop DOB: Nov 04, 1980 MRN: [id number]   Date of Service  12/06/2022  HPI/Events of Note  Patient with altered mental status due to large sub-dural hematoma with increased intracranial pressure and partial uncal herniation, he is s/p craniotomy for evacuation of the hematoma, he was admitted to the ICU post-operatively, intubated, sedated, and mechanically ventilated.  eICU Interventions  New Patient Evaluation.        Migdalia Dk 12/06/2022, 9:26 PM

## 2022-12-06 NOTE — Progress Notes (Signed)
RT assisted with transport of this pt from ED trauma C to OR while on full ventilatory support. Pt tolerated well with SVS and no complications. OR staff took pt on ventilator into OR room.

## 2022-12-06 NOTE — ED Notes (Signed)
Pt was taken to CT by this RN. Pt became agitated and was trying to get out of CT table. Pt was ripping off EKG leads and equipment. Additional 10mg  Valium IVP given at this time. Will continue to monitor. Restraints continued.

## 2022-12-06 NOTE — ED Notes (Signed)
Taken to CT and back to room by this RN with RT. Pt woke up a few times. Restraints maintained at this time.

## 2022-12-06 NOTE — Progress Notes (Signed)
RT and CRNA transported vent patient from OR to 4N ICU. Vital signs stable through out.

## 2022-12-06 NOTE — Consult Note (Signed)
CC: subdural hematoma  HPI:     Patient is a 42 y.o. male presents with altered mental status.  For the past few days, has been complaining of headache, took a "bottle of Advil" recently per mother for the headache.  He then developed severe altered mental status with unresponsiveness today.  He was brought to the ER where he was localizing but nonverbal and not following commands, lethargic and was intubated.  Patient has a strong family history of Huntington's on father's side and mother has noted increasing twitching chronically.  Additionally, he drinks heavily and per brother, regularly takes cocaine.  Of note, he did suffer head trauma on April 4th after a fall from ladder, but head CT showed only tiny cingulate sulcal SAH, no subdural hemorrhage.    There are no problems to display for this patient.  Past Medical History:  Diagnosis Date   Panic attack     History reviewed. No pertinent surgical history.  (Not in a hospital admission)  Allergies  Allergen Reactions   Zidovudine     `    Social History   Tobacco Use   Smoking status: Every Day    Packs/day: .5    Types: Cigarettes   Smokeless tobacco: Never  Substance Use Topics   Alcohol use: Yes    Comment: occ    Family History  Problem Relation Age of Onset   Cancer Other      Review of Systems Review of systems not obtained due to patient factors.  Objective:   Patient Vitals for the past 8 hrs:  BP Temp Temp src Pulse Resp SpO2 Height  12/06/22 1745 102/67 -- -- (!) 54 16 100 % --  12/06/22 1740 106/68 -- -- (!) 53 13 100 % --  12/06/22 1735 105/64 -- -- (!) 55 16 100 % --  12/06/22 1730 102/62 -- -- (!) 57 16 100 % --  12/06/22 1715 109/72 -- -- 62 17 100 % --  12/06/22 1700 117/72 -- -- 70 16 100 % --  12/06/22 1655 127/72 98.3 F (36.8 C) -- 71 16 100 % --  12/06/22 1650 131/79 -- -- 77 15 100 % 5\' 10"  (1.778 m)  12/06/22 1645 (!) 161/70 -- -- 94 (!) 21 100 % --  12/06/22 1635 (!) 142/98 -- --  89 19 100 % --  12/06/22 1605 119/72 -- -- 70 16 100 % --  12/06/22 1545 115/77 -- -- 73 14 99 % --  12/06/22 1515 128/78 -- -- 77 (!) 21 100 % --  12/06/22 1445 126/74 -- -- 67 20 99 % --  12/06/22 1434 (!) 112/55 99.3 F (37.4 C) Rectal 72 (!) 29 100 % --   No intake/output data recorded. Total I/O In: 1000 [IV Piggyback:1000] Out: -     Intubated On Fentanyl, propofol gtt Eyes closed Pupils pinpoint and nonreactive Weak withdrawal in left UE, minimal on right  Data ReviewCBC:  Lab Results  Component Value Date   WBC 12.0 (H) 12/06/2022   RBC 4.77 12/06/2022   BMP:  Lab Results  Component Value Date   GLUCOSE 130 (H) 12/06/2022   CO2 23 12/06/2022   BUN 9 12/06/2022   CREATININE 0.79 12/06/2022   CALCIUM 9.1 12/06/2022   Radiology review:  CT head without contrast reviewed.  Large 2 cm left convexity subacute SDH with 1.8 cm MLS, ventricular trapping  Assessment:   Active Problems:   * No active hospital problems. *  42 yo  M with hx of polysubstance abuse, possible Huntington's disease with large left subacute SDH   Plan:   - plan for emergent craniotomy for evacuation.  OR team was alerted earlier today.  Risks, benefits, alternatives, and expected convalescence were discussed with patient's mother at bedside,  and brother.  Risks discussed included but were not limited to, bleeding, pain, infection, recurrence, stroke, scar, spinal fluid leak, pseudomeningocele, neurologic deficit, nausea, vomiting, and death.  Informed consent was obtained.   his prognosis is unclear-- severe mass effect suggests strong possibility of poor neurologic outcome, but recency of symptoms suggest he has a chance for good recovery.

## 2022-12-06 NOTE — Op Note (Signed)
Procedure(s): Left CRANIOTOMY HEMATOMA EVACUATION SUBDURAL Procedure Note  Joshua Bishop male 42 y.o. 12/06/2022  Procedure(s) and Anesthesia Type:    * CRANIOTOMY HEMATOMA EVACUATION SUBDURAL - General  Surgeon(s) and Role:    [name], Coy Saunas, MD - Primary   Indications: This is a 42 yo M who developed a large left subacute SDH with herniation on imaging, GCS 6T.  I recommended craniotomy for evacuation.  Risks, benefits, alternatives, and expected convalescence were discussed with patient's mother and brother.  Risks discussed included but were not limited to bleeding, pain, infection, seizure, stroke, scar, subdural recurrence, neurologic deficit, coma, and death.  Informed consent was obtained.  Surgeon: Bedelia Person   Anesthesia: General endotracheal anesthesia   Procedure in detail:  The patient was brought to the operating room.  A timeout was performed.  General anesthesia was induced and patient was intubated by the anesthesia service.  After appropriate lines and monitors were placed, patient's head was turned to the right and scalp was shaved, preprepped with alcohol and cleansing solution and prepped and draped in sterile fashion.  1% lidocaine with epinephrine injected into the planned incision.  A linear  incision was made with a 10 blade and monopolar electrocautery was used to open the galea and incised the temporalis fascia.  The periosteum was dissected off the skull   and self retaining retractors placed.  Bur holes were placed and were used to dissect the dura from the inner table of the skull.  Craniotome was used to perform a craniotomy.  Meticulous epidural hemostasis obtained.  meningeal artery branches were coagulated to help reduce risk of recurrence.  The dura was then opened in U-shaped manner and flapped medially.  Thick outer membrane was encountered.  This was coagulated and cut.  Copious amount of dark purplish blood was evacuated, indicative of  early subacute hematoma.      Subdural space was irrigated thoroughly to ensure no active bleeding and the irrigation returned clear.   There was thick diaphonous membrane adherent to the brain which could not safely be dissected off of the pia/arachnoid.  The brain remained very sunken so a subdural drain was placed, tunneled out the skin and secured with a stitch.  The dura was closed with 4-0 Nurolon with a DuraGen onlay placed on top of that.  The bone flap was replaced with Stryker plating system.  The wound is irrigated thoroughly w The galea was closed with 2-0 Vicryl sutures in buried fashion.  Skin was closed with staples.  Sterile dressing was then placed on the incision.  Drain was hooked to drainage bag to gravity. All counts were correct at the end of surgery.  No complications were noted.  Findings: Large early subacute subdural hematoma  Estimated Blood Loss:  100 ml         Drains: Subdural drain        Specimens: none         Implants: Stryker plating        Complications:  * No complications entered in OR log *         Disposition: PACU - hemodynamically stable.         Condition: stable

## 2022-12-07 ENCOUNTER — Inpatient Hospital Stay (HOSPITAL_COMMUNITY): Payer: Medicaid Other

## 2022-12-07 DIAGNOSIS — S065XAA Traumatic subdural hemorrhage with loss of consciousness status unknown, initial encounter: Secondary | ICD-10-CM | POA: Diagnosis not present

## 2022-12-07 LAB — PHOSPHORUS: Phosphorus: 4.8 mg/dL — ABNORMAL HIGH (ref 2.5–4.6)

## 2022-12-07 LAB — CBC
HCT: 37 % — ABNORMAL LOW (ref 39.0–52.0)
Hemoglobin: 12.3 g/dL — ABNORMAL LOW (ref 13.0–17.0)
MCH: 29.9 pg (ref 26.0–34.0)
MCHC: 33.2 g/dL (ref 30.0–36.0)
MCV: 90 fL (ref 80.0–100.0)
Platelets: 211 10*3/uL (ref 150–400)
RBC: 4.11 MIL/uL — ABNORMAL LOW (ref 4.22–5.81)
RDW: 13.7 % (ref 11.5–15.5)
WBC: 7.9 10*3/uL (ref 4.0–10.5)
nRBC: 0 % (ref 0.0–0.2)

## 2022-12-07 LAB — MAGNESIUM: Magnesium: 2 mg/dL (ref 1.7–2.4)

## 2022-12-07 LAB — COMPREHENSIVE METABOLIC PANEL
ALT: 12 U/L (ref 0–44)
AST: 12 U/L — ABNORMAL LOW (ref 15–41)
Albumin: 3.2 g/dL — ABNORMAL LOW (ref 3.5–5.0)
Alkaline Phosphatase: 48 U/L (ref 38–126)
Anion gap: 9 (ref 5–15)
BUN: 11 mg/dL (ref 6–20)
CO2: 24 mmol/L (ref 22–32)
Calcium: 8.8 mg/dL — ABNORMAL LOW (ref 8.9–10.3)
Chloride: 107 mmol/L (ref 98–111)
Creatinine, Ser: 0.84 mg/dL (ref 0.61–1.24)
GFR, Estimated: >60 mL/min (ref >60)
Glucose, Bld: 135 mg/dL — ABNORMAL HIGH (ref 70–99)
Potassium: 4.2 mmol/L (ref 3.5–5.1)
Sodium: 140 mmol/L (ref 135–145)
Total Bilirubin: 0.7 mg/dL (ref 0.3–1.2)
Total Protein: 5.5 g/dL — ABNORMAL LOW (ref 6.5–8.1)

## 2022-12-07 LAB — GLUCOSE, CAPILLARY
Glucose-Capillary: 106 mg/dL — ABNORMAL HIGH (ref 70–99)
Glucose-Capillary: 121 mg/dL — ABNORMAL HIGH (ref 70–99)
Glucose-Capillary: 131 mg/dL — ABNORMAL HIGH (ref 70–99)
Glucose-Capillary: 82 mg/dL (ref 70–99)
Glucose-Capillary: 91 mg/dL (ref 70–99)
Glucose-Capillary: 97 mg/dL (ref 70–99)

## 2022-12-07 LAB — POCT I-STAT 7, (LYTES, BLD GAS, ICA,H+H)
Acid-Base Excess: 0 mmol/L (ref 0.0–2.0)
Bicarbonate: 25.4 mmol/L (ref 20.0–28.0)
Calcium, Ion: 1.26 mmol/L (ref 1.15–1.40)
HCT: 36 % — ABNORMAL LOW (ref 39.0–52.0)
Hemoglobin: 12.2 g/dL — ABNORMAL LOW (ref 13.0–17.0)
O2 Saturation: 99 %
Patient temperature: 97.5
Potassium: 4.2 mmol/L (ref 3.5–5.1)
Sodium: 141 mmol/L (ref 135–145)
TCO2: 27 mmol/L (ref 22–32)
pCO2 arterial: 40.9 mmHg (ref 32–48)
pH, Arterial: 7.398 (ref 7.35–7.45)
pO2, Arterial: 135 mmHg — ABNORMAL HIGH (ref 83–108)

## 2022-12-07 LAB — TRIGLYCERIDES: Triglycerides: 77 mg/dL (ref <150)

## 2022-12-07 MED ORDER — CHLORHEXIDINE GLUCONATE CLOTH 2 % EX PADS
6.0000 | MEDICATED_PAD | Freq: Every day | CUTANEOUS | Status: DC
  Administered 2022-12-08 (×2): 6 via TOPICAL

## 2022-12-07 MED ORDER — ADULT MULTIVITAMIN W/MINERALS CH
1.0000 | ORAL_TABLET | Freq: Every day | ORAL | Status: DC
  Administered 2022-12-07: 1
  Filled 2022-12-07: qty 1

## 2022-12-07 MED ORDER — MIDAZOLAM HCL 2 MG/2ML IJ SOLN: 2.0000 mg | INTRAMUSCULAR | Status: DC | PRN

## 2022-12-07 MED ORDER — OSMOLITE 1.5 CAL PO LIQD
1000.0000 mL | ORAL | Status: DC
  Administered 2022-12-07: 1000 mL

## 2022-12-07 MED ORDER — PROSOURCE TF20 ENFIT COMPATIBL EN LIQD
60.0000 mL | Freq: Every day | ENTERAL | Status: DC
  Administered 2022-12-07: 60 mL
  Filled 2022-12-07: qty 60

## 2022-12-07 MED ORDER — VITAL HIGH PROTEIN PO LIQD: 1000.0000 mL | ORAL | Status: DC

## 2022-12-07 NOTE — Progress Notes (Signed)
Initial Nutrition Assessment  DOCUMENTATION CODES:   Not applicable  INTERVENTION:   Initiate enteral nutrition via OG tube: - Start Osmolite 1.5 @ 40 ml/hr and advance rate by 10 ml q 8 hours to goal rate of 60 ml/hr (1440 ml/day) - PROSource TF20 60 ml daily  Tube feeding regimen at goal rate provides 2240 kcal, 110 grams of protein, and 1097 ml of H2O.  - MVI with minerals daily per tube  - Recommend thiamine 100 mg daily and folic acid 1 mg daily given reports of EtOH abuse  NUTRITION DIAGNOSIS:   Inadequate oral intake related to inability to eat as evidenced by NPO status.  GOAL:   Patient will meet greater than or equal to 90% of their needs  MONITOR:   Vent status, Labs, Weight trends, TF tolerance  REASON FOR ASSESSMENT:   Ventilator, Consult Enteral/tube feeding initiation and management  ASSESSMENT:   42 year old male who presented to the ED on 6/21 with AMS. PMH of Huntington's, EtOH abuse, polysubstance abuse, anxiety, recent admission for traumatic subarachnoid hemorrhage. Pt required intubation. Pt admitted with large L subacute SDH, cerebral edema with brain compression, midline shift, and left-sided uncal herniation.  06/21 - s/p L craniotomy for evacuation of hematoma  RD working remotely.  Consult received for enteral nutrition initiation and management. Pt with OG tube tip and side port projecting over gastric fundus per abdominal x-ray yesterday.  Unable to obtain diet and weight history at this time. Weight of 165 lbs on admission appears to be stated rather than measured. If accurate, pt has experienced a 9.1 kg weight loss since 09/19/22. This is a 10.8% weight loss in 2 months which is severe and significant for timeframe. If weight loss is accurate, pt is at high risk for malnutrition. Will obtain NFPE at follow-up.  Patient is currently intubated on ventilator support Temp (24hrs), Avg:97.6 F (36.4 C), Min:96.8 F (36 C), Max:99.3 F (37.4  C)  Drips: Propofol: 25 mcg/kg/min (provides 317 kcal daily from lipid) Fentanyl LR: 75 ml/hr  Medications reviewed and include: pepcid, SSI q 4 hours, IV protonix, IV abx  Labs reviewed: phosphorus 4.8 CBG's: 97-131 x 24 hours  UOP: 1340 ml x 12 hours ICP/ventriculostomy: 81 ml x 12 hours I/O's: +2.2 L since admit  NUTRITION - FOCUSED PHYSICAL EXAM:  Unable to complete at this time. RD working remotely.  Diet Order:   Diet Order             Diet NPO time specified  Diet effective now                   EDUCATION NEEDS:   No education needs have been identified at this time  Skin:  Skin Assessment: Skin Integrity Issues: Incisions: head  Last BM:  no documented BM  Height:   Ht Readings from Last 1 Encounters:  12/06/22 5\' 10"  (1.778 m)    Weight:   Wt Readings from Last 1 Encounters:  12/06/22 74.8 kg    BMI:  Body mass index is 23.68 kg/m.  Estimated Nutritional Needs:   Kcal:  2200-2400  Protein:  105-120 grams  Fluid:  >2.0 L    Mertie Clause, MS, RD, LDN Inpatient Clinical Dietitian Please see AMiON for contact information.

## 2022-12-07 NOTE — Progress Notes (Signed)
NAME:  Joshua Bishop, MRN:  [id number], DOB:  1980/10/09, LOS: 1 ADMISSION DATE:  12/06/2022, CONSULTATION DATE: 12/06/2022 REFERRING MD:  Linwood Dibbles, MD , CHIEF COMPLAINT: Altered mental status  History of Present Illness:  Patient is intubated and sedated, most of the history is taken from chart review and from patient's mother.  42 year old male with history of anxiety/panic attacks, recent admission for traumatic subarachnoid hemorrhage in April 2024 who was brought into the emergency department by her mother due to altered mental status for last 2 to 3 days.  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  Patient and mother urged him to go to hospital but he was refusing today he was noted to be severely altered with decreased responsiveness, his mother called EMS and he was brought to the emergency department. In the ED patient was noted to be agitated, restless, trying to take out his IV lines, he was sent for CT head which was initially unable to be completed because of restlessness and agitation, he was given Valium 10 mg without much help, he was intubated for airway protection, head CT showed large frontoparietal left-sided subacute subdural hematoma with severe cerebral edema and left-sided uncal herniation.  Neurosurgery was consulted, patient is going to OR for evacuation of subdural hematoma and possible bur holes.  PCCM was consulted for help evaluation medical management and admission to ICU  Of note patient has family history of Huntington's chorea, his father died from this illness  Pertinent  Medical History   Past Medical History:  Diagnosis Date   Panic attack      Significant Hospital Events: Including procedures, antibiotic start and stop dates in addition to other pertinent events     Interim History / Subjective:  Patient lives with mother. Fiancee and mother at  bedside. [place] and wonders if something "happened" there because he wasn't the same when he came back.   Objective   Blood pressure (!) 112/52, pulse (!) 55, temperature (!) 97.5 F (36.4 C), resp. rate 16, height 5\' 10"  (1.778 m), weight 74.8 kg, SpO2 99 %.    Vent Mode: PRVC FiO2 (%):  [40 %-100 %] 40 % Set Rate:  [16 bmp] 16 bmp Vt Set:  [580 mL] 580 mL PEEP:  [5 cmH20] 5 cmH20 Plateau Pressure:  [12 cmH20-13 cmH20] 13 cmH20   Intake/Output Summary (Last 24 hours) at 12/07/2022 1011 Last data filed at 12/07/2022 0600 Gross per 24 hour  Intake 3738.09 ml  Output 1521 ml  Net 2217.09 ml   Filed Weights   12/06/22 1800  Weight: 74.8 kg    Examination:   Physical exam: Gen:      Intubated, sedated, acutely ill appearing HEENT:  ETT to vent, left hemicraniectomy dressing in place, EVD with sanguinous drainage Lungs:    sounds of mechanical ventilation auscultated no wheeze CV:         RRR no mrg Abd:      + bowel sounds; soft, non-tender; no palpable masses, no distension Ext:    No edema Skin:      Warm and dry; no rashes Neuro:   sedated, RASS -3   Labs and images were reviewed ABG reviewed this am - no acid/abse disturbance Na 141 K 4.2 Hgb 12.2  Resolved Hospital Problem list  Assessment & Plan:  Left large subacute subdural hematoma Cerebral edema with brain compression, midline shift of 18 mm Left-sided uncal herniation POD 1 left hemicraniectomy for SDH evacuation and bur hole placement Nsgy following. Repeat head ct this am - will review with them and await radiology interpretation Continue neuro watch every 2 hours He is getting mannitol now Keep head end of the bed elevated between 30-45 degrees, in neutral position Avoid fever and seizure Continue Keppra 500 mg twice daily for seizure prophylaxis as subdural hematoma poses a high risk of seizures Repeat imaging deferred to neurosurgery S/p  hypertonic saline  Acute respiratory insufficiency Acute encephalopathy due to subdural hematoma Patient was unable to protect his airway in the setting of altered mental status polysubstance abuse He was intubated Continue lung protective ventilation VAP prevention bundle in place PAD protocol with propofol and fentanyl with RASS goal -1  Polysubstance abuse Patient's UDS is positive for cocaine and benzos Watch for signs of withdrawal  Best Practice (right click and "Reselect all SmartList Selections" daily)   Diet/type: NPO If not planning to extubate today will start tube feeds DVT prophylaxis: SCD GI prophylaxis: H2B Lines: N/A Foley:  Yes, and it is still needed Code Status:  full code Last date of multidisciplinary goals of care discussion [6/22: mother updated at bedside]  The patient is critically ill due to respiratory failure, encephalopathy, SDH.  Critical care was necessary to treat or prevent imminent or life-threatening deterioration.  Critical care was time spent personally by me on the following activities: development of treatment plan with patient and/or surrogate as well as nursing, discussions with consultants, evaluation of patient's response to treatment, examination of patient, obtaining history from patient or surrogate, ordering and performing treatments and interventions, ordering and review of laboratory studies, ordering and review of radiographic studies, pulse oximetry, re-evaluation of patient's condition and participation in multidisciplinary rounds.   Critical Care Time devoted to patient care services described in this note is 39 minutes. This time reflects time of care of this signee Charlott Holler . This critical care time does not reflect separately billable procedures or procedure time, teaching time or supervisory time of PA/NP/Med student/Med Resident etc but could involve care discussion time.       Mickel Baas [facility] 12/07/2022 10:14 AM  Pager: see AMION  If no response to pager , please call critical care on call (see AMION) until 7pm After 7:00 pm call Elink

## 2022-12-07 NOTE — Progress Notes (Signed)
Pt was transported to CT scan and back without complications.  

## 2022-12-07 NOTE — Progress Notes (Signed)
Subjective: NAEs  Objective: Vital signs in last 24 hours: Temp:  [96.8 F (36 C)-99.3 F (37.4 C)] 97.5 F (36.4 C) (06/22 0600) Pulse Rate:  [53-94] 55 (06/22 0600) Resp:  [12-29] 16 (06/22 0600) BP: (94-161)/(52-98) 112/52 (06/22 0736) SpO2:  [99 %-100 %] 99 % (06/22 0600) Arterial Line BP: (99-136)/(49-62) 110/51 (06/22 0600) FiO2 (%):  [40 %-100 %] 40 % (06/22 0736) Weight:  [74.8 kg] 74.8 kg (06/21 1800)  Intake/Output from previous day: 06/21 0701 - 06/22 0700 In: 3738.1 [I.V.:2612.6; IV Piggyback:1125.5] Out: 1521 [Urine:1340; Drains:81; Blood:100] Intake/Output this shift: No intake/output data recorded.  Intubated, sedated on propofol, Fentanyl Dressing c/d/I Per nursing, patient was localizing briskly bilaterally when sedation was being weaned but was immediately trying to self-extubate  Lab Results: Recent Labs    12/06/22 1433 12/06/22 1618 12/07/22 0439 12/07/22 0524  WBC 12.0*  --  7.9  --   HGB 14.6   < > 12.3* 12.2*  HCT 43.0   < > 37.0* 36.0*  PLT 259  --  211  --    < > = values in this interval not displayed.   BMET Recent Labs    12/06/22 1433 12/06/22 1618 12/07/22 0439 12/07/22 0524  NA 137   < > 140 141  K 3.9   < > 4.2 4.2  CL 104  --  107  --   CO2 23  --  24  --   GLUCOSE 130*  --  135*  --   BUN 9  --  11  --   CREATININE 0.79  --  0.84  --   CALCIUM 9.1  --  8.8*  --    < > = values in this interval not displayed.    Studies/Results: CT HEAD WO CONTRAST  Result Date: 12/07/2022 CLINICAL DATA:  Postop craniotomy EXAM: CT HEAD WITHOUT CONTRAST TECHNIQUE: Contiguous axial images were obtained from the base of the skull through the vertex without intravenous contrast. RADIATION DOSE REDUCTION: This exam was performed according to the departmental dose-optimization program which includes automated exposure control, adjustment of the mA and/or kV according to patient size and/or use of iterative reconstruction technique. COMPARISON:   12/06/2022 FINDINGS: Brain: Evacuation of subdural hematoma with space primarily filled with gas. Midline shift is significantly improved, now measuring 6 mm. No evidence of complicating infarct or hydrocephalus. Vascular: No hyperdense vessel or unexpected calcification. Skull: Unremarkable craniotomy site with drain. Sinuses/Orbits: Negative IMPRESSION: Evacuated subdural hematoma with improved midline shift now measuring 6 mm. The residual space contains gas with no recurrent hemorrhage. Electronically Signed   By: Tiburcio Pea M.D.   On: 12/07/2022 09:29   CT Cervical Spine Wo Contrast  Result Date: 12/06/2022 CLINICAL DATA:  Neck trauma, intracranial hemorrhage EXAM: CT CERVICAL SPINE WITHOUT CONTRAST TECHNIQUE: Multidetector CT imaging of the cervical spine was performed without intravenous contrast. Multiplanar CT image reconstructions were also generated. RADIATION DOSE REDUCTION: This exam was performed according to the departmental dose-optimization program which includes automated exposure control, adjustment of the mA and/or kV according to patient size and/or use of iterative reconstruction technique. COMPARISON:  09/19/2022 FINDINGS: Alignment: Alignment is grossly anatomic. Skull base and vertebrae: No acute fracture. No primary bone lesion or focal pathologic process. Soft tissues and spinal canal: No prevertebral fluid or swelling. No visible canal hematoma. Disc levels:  Mild spondylosis at C5-6 and C6-7 unchanged. Upper chest: Endotracheal tube and enteric catheter are identified. Lung apices are clear. Other: Reconstructed images demonstrate no  additional findings. Please see preceding head CT describing left-sided subdural hematoma and mass effect. IMPRESSION: 1. No acute cervical spine fracture. 2. Please see preceding head CT report describing large left subdural hematoma and associated mass effect. Electronically Signed   By: Sharlet Salina M.D.   On: 12/06/2022 17:43   DG Abdomen 1  View  Result Date: 12/06/2022 CLINICAL DATA:  Enteric catheter placement EXAM: ABDOMEN - 1 VIEW COMPARISON:  None Available. FINDINGS: Frontal view of the lower chest and upper abdomen demonstrates enteric catheter passing below diaphragm tip and side port projecting over the gastric fundus. Bowel gas pattern is unremarkable. Endotracheal tube tip projects over the tracheal air column at the thoracic inlet. Lungs are clear. IMPRESSION: 1. Enteric catheter tip projecting over gastric fundus. Electronically Signed   By: Sharlet Salina M.D.   On: 12/06/2022 17:20   DG Chest Portable 1 View  Result Date: 12/06/2022 CLINICAL DATA:  Altered level of consciousness EXAM: PORTABLE CHEST 1 VIEW COMPARISON:  02/07/2019 FINDINGS: Single frontal view of the chest demonstrates endotracheal tube overlying tracheal air column tip at thoracic inlet. Enteric catheter passes below diaphragm tip excluded by collimation. Cardiac silhouette is unremarkable. No acute airspace disease, effusion, or pneumothorax. No acute bony abnormalities. IMPRESSION: 1. No complication after intubation.  No acute process. Electronically Signed   By: Sharlet Salina M.D.   On: 12/06/2022 17:20   CT Head Wo Contrast  Result Date: 12/06/2022 CLINICAL DATA:  Mental status change, unknown cause. EXAM: CT HEAD WITHOUT CONTRAST TECHNIQUE: Contiguous axial images were obtained from the base of the skull through the vertex without intravenous contrast. RADIATION DOSE REDUCTION: This exam was performed according to the departmental dose-optimization program which includes automated exposure control, adjustment of the mA and/or kV according to patient size and/or use of iterative reconstruction technique. COMPARISON:  CTA head/neck 09/19/2022. FINDINGS: Brain: Large left frontoparietal convexity subdural hematoma measuring up to 20 mm in the coronal plane (coronal image 36 series 5) with 18 mm of rightward midline shift and left uncal herniation with  torsion of the brainstem and entrapment of the lateral ventricles at the level of the third ventricle. Partial effacement of the suprasellar and prepontine cisterns. No tonsillar herniation at this time. Vascular: No hyperdense vessel or unexpected calcification. Skull: No calvarial fracture or suspicious bone lesion. Skull base is unremarkable. Sinuses/Orbits: Unremarkable. Other: None. IMPRESSION: Large left frontoparietal convexity subdural hematoma measuring up to 20 mm in the coronal plane with 18 mm of rightward midline shift and left uncal herniation with torsion of the brainstem and entrapment of the lateral ventricles at the level of the third ventricle. Critical Value/emergent results were called by telephone at the time of interpretation on 12/06/2022 at 4:10 pm to provider [facility] , who verbally acknowledged these results. Electronically Signed   By: Orvan Falconer M.D.   On: 12/06/2022 16:14    Assessment/Plan: This is a 42 year old man who is status post left craniotomy for early subacute subdural hematoma -Keppra x 7 days -Continue subdural drain - Avoid hypotonics as we would like to promote brain reexpansion at this point to fill the subdural space - I discussed with the patient's family at the bedside that with his CAT scan shows significant improvement in his midline shift which is encouraging.  I think tomorrow morning he may be a candidate for a ventilatory weaning trial.  TIMATHY NEWBERRY 12/07/2022, 10:28 AM

## 2022-12-07 NOTE — Plan of Care (Signed)
  Problem: Clinical Measurements: Goal: Neurologic status will improve Outcome: Progressing Goal: Ability to maintain intracranial pressure will improve Outcome: Progressing   Problem: Skin Integrity: Goal: Demonstration of wound healing without infection will improve Outcome: Progressing   Problem: Safety: Goal: Non-violent Restraint(s) Outcome: Not Progressing   Problem: Education: Goal: Knowledge of the prescribed therapeutic regimen will improve Outcome: Not Progressing   Problem: Clinical Measurements: Goal: Usual level of consciousness will be regained or maintained. Outcome: Not Progressing

## 2022-12-08 LAB — GLUCOSE, CAPILLARY
Glucose-Capillary: 105 mg/dL — ABNORMAL HIGH (ref 70–99)
Glucose-Capillary: 106 mg/dL — ABNORMAL HIGH (ref 70–99)
Glucose-Capillary: 123 mg/dL — ABNORMAL HIGH (ref 70–99)
Glucose-Capillary: 88 mg/dL (ref 70–99)
Glucose-Capillary: 97 mg/dL (ref 70–99)
Glucose-Capillary: 99 mg/dL (ref 70–99)

## 2022-12-08 MED ORDER — DEXMEDETOMIDINE HCL IN NACL 400 MCG/100ML IV SOLN
0.0000 ug/kg/h | INTRAVENOUS | Status: DC
  Administered 2022-12-08 (×3): 1.2 ug/kg/h via INTRAVENOUS
  Administered 2022-12-09: 1 ug/kg/h via INTRAVENOUS
  Administered 2022-12-09 (×4): 1.2 ug/kg/h via INTRAVENOUS
  Administered 2022-12-10 (×2): 1 ug/kg/h via INTRAVENOUS
  Administered 2022-12-11: 0.8 ug/kg/h via INTRAVENOUS
  Administered 2022-12-11: 0.6 ug/kg/h via INTRAVENOUS
  Administered 2022-12-12: 1.5 ug/kg/h via INTRAVENOUS
  Administered 2022-12-12: 0.9 ug/kg/h via INTRAVENOUS
  Administered 2022-12-12: 0.8 ug/kg/h via INTRAVENOUS
  Administered 2022-12-13 (×2): 0.9 ug/kg/h via INTRAVENOUS
  Administered 2022-12-13: 0.4 ug/kg/h via INTRAVENOUS
  Administered 2022-12-13 – 2022-12-14 (×2): 1.2 ug/kg/h via INTRAVENOUS
  Administered 2022-12-14: 1.4 ug/kg/h via INTRAVENOUS
  Administered 2022-12-14: 0.5 ug/kg/h via INTRAVENOUS
  Administered 2022-12-14: 0.9 ug/kg/h via INTRAVENOUS
  Administered 2022-12-14: 0.7 ug/kg/h via INTRAVENOUS
  Administered 2022-12-15: 1.5 ug/kg/h via INTRAVENOUS
  Administered 2022-12-15: 0.4 ug/kg/h via INTRAVENOUS
  Administered 2022-12-15: 1.5 ug/kg/h via INTRAVENOUS
  Administered 2022-12-15: 1.3 ug/kg/h via INTRAVENOUS
  Administered 2022-12-15: 1.1 ug/kg/h via INTRAVENOUS
  Filled 2022-12-08 (×30): qty 100

## 2022-12-08 MED ORDER — LORAZEPAM 1 MG PO TABS
1.0000 mg | ORAL_TABLET | Freq: Three times a day (TID) | ORAL | Status: DC
  Administered 2022-12-08 – 2022-12-09 (×3): 1 mg via ORAL
  Filled 2022-12-08 (×3): qty 1

## 2022-12-08 MED ORDER — NICOTINE 21 MG/24HR TD PT24
21.0000 mg | MEDICATED_PATCH | Freq: Every day | TRANSDERMAL | Status: DC
  Administered 2022-12-08 – 2022-12-18 (×11): 21 mg via TRANSDERMAL
  Filled 2022-12-08 (×11): qty 1

## 2022-12-08 MED ORDER — PHENOBARBITAL SODIUM 130 MG/ML IJ SOLN
130.0000 mg | Freq: Three times a day (TID) | INTRAMUSCULAR | Status: AC
  Administered 2022-12-08 – 2022-12-13 (×17): 130 mg via INTRAVENOUS
  Filled 2022-12-08 (×18): qty 1

## 2022-12-08 MED ORDER — DEXMEDETOMIDINE HCL IN NACL 400 MCG/100ML IV SOLN
INTRAVENOUS | Status: AC
  Filled 2022-12-08: qty 100

## 2022-12-08 MED ORDER — FAMOTIDINE 20 MG PO TABS
20.0000 mg | ORAL_TABLET | Freq: Two times a day (BID) | ORAL | Status: DC
  Administered 2022-12-08 – 2022-12-17 (×17): 20 mg via ORAL
  Filled 2022-12-08 (×18): qty 1

## 2022-12-08 MED ORDER — POLYETHYLENE GLYCOL 3350 17 G PO PACK: 17.0000 g | PACK | Freq: Every day | ORAL | Status: DC | PRN

## 2022-12-08 MED ORDER — CHLORHEXIDINE GLUCONATE CLOTH 2 % EX PADS
6.0000 | MEDICATED_PAD | Freq: Every day | CUTANEOUS | Status: DC
  Administered 2022-12-09 – 2022-12-15 (×9): 6 via TOPICAL

## 2022-12-08 MED ORDER — MIDAZOLAM HCL 2 MG/2ML IJ SOLN
INTRAMUSCULAR | Status: AC
  Filled 2022-12-08: qty 2

## 2022-12-08 MED ORDER — MIDAZOLAM HCL 2 MG/2ML IJ SOLN
2.0000 mg | INTRAMUSCULAR | Status: DC | PRN
  Administered 2022-12-08 – 2022-12-09 (×3): 2 mg via INTRAVENOUS
  Filled 2022-12-08 (×3): qty 2

## 2022-12-08 MED ORDER — ADULT MULTIVITAMIN W/MINERALS CH
1.0000 | ORAL_TABLET | Freq: Every day | ORAL | Status: DC
  Administered 2022-12-09 – 2022-12-18 (×8): 1 via ORAL
  Filled 2022-12-08 (×9): qty 1

## 2022-12-08 MED ORDER — DOCUSATE SODIUM 50 MG/5ML PO LIQD: 100.0000 mg | Freq: Two times a day (BID) | ORAL | Status: DC | PRN

## 2022-12-08 MED ORDER — SODIUM CHLORIDE 0.9 % IV SOLN
260.0000 mg | Freq: Once | INTRAVENOUS | Status: AC
  Administered 2022-12-08: 260 mg via INTRAVENOUS
  Filled 2022-12-08: qty 2

## 2022-12-08 NOTE — Anesthesia Postprocedure Evaluation (Signed)
Anesthesia Post Note  Patient: Joshua Bishop  Procedure(s) Performed: CRANIOTOMY HEMATOMA EVACUATION SUBDURAL (Left: Head)     Patient location during evaluation: SICU Anesthesia Type: General Level of consciousness: sedated Pain management: pain level controlled Vital Signs Assessment: post-procedure vital signs reviewed and stable Respiratory status: patient remains intubated per anesthesia plan Cardiovascular status: stable Postop Assessment: no apparent nausea or vomiting Anesthetic complications: no   No notable events documented.  Last Vitals:  Vitals:   12/08/22 0700 12/08/22 0748  BP: (!) 102/56   Pulse: (!) 57 (!) 55  Resp: 16   Temp: 37 C   SpO2: 100% 100%    Last Pain:  Vitals:   12/08/22 0700  TempSrc: Bladder                 Defne Gerling

## 2022-12-08 NOTE — Progress Notes (Signed)
Subjective: Extubated this morning.  C/o mild HA  Objective: Vital signs in last 24 hours: Temp:  [96.8 F (36 C)-98.6 F (37 C)] 98.6 F (37 C) (06/23 0700) Pulse Rate:  [44-75] 55 (06/23 0748) Resp:  [14-22] 16 (06/23 0700) BP: (94-129)/(56-84) 102/56 (06/23 0700) SpO2:  [93 %-100 %] 100 % (06/23 0748) Arterial Line BP: (132-146)/(57-73) 146/73 (06/22 1300) FiO2 (%):  [40 %] 40 % (06/23 0851)  Intake/Output from previous day: 06/22 0701 - 06/23 0700 In: 4082.2 [I.V.:3123.9; NG/GT:608.2; IV Piggyback:350.2] Out: 1835 [Urine:1610; Emesis/NG output:200; Drains:25] Intake/Output this shift: No intake/output data recorded.  Eyes open spontaneous, confused, oriented to person. Wandering attention No gross pronator drift noted SD drain removed at bedside  Lab Results: Recent Labs    12/06/22 1433 12/06/22 1618 12/07/22 0439 12/07/22 0524  WBC 12.0*  --  7.9  --   HGB 14.6   < > 12.3* 12.2*  HCT 43.0   < > 37.0* 36.0*  PLT 259  --  211  --    < > = values in this interval not displayed.   BMET Recent Labs    12/06/22 1433 12/06/22 1618 12/07/22 0439 12/07/22 0524  NA 137   < > 140 141  K 3.9   < > 4.2 4.2  CL 104  --  107  --   CO2 23  --  24  --   GLUCOSE 130*  --  135*  --   BUN 9  --  11  --   CREATININE 0.79  --  0.84  --   CALCIUM 9.1  --  8.8*  --    < > = values in this interval not displayed.    Studies/Results: CT HEAD WO CONTRAST  Result Date: 12/07/2022 CLINICAL DATA:  Postop craniotomy EXAM: CT HEAD WITHOUT CONTRAST TECHNIQUE: Contiguous axial images were obtained from the base of the skull through the vertex without intravenous contrast. RADIATION DOSE REDUCTION: This exam was performed according to the departmental dose-optimization program which includes automated exposure control, adjustment of the mA and/or kV according to patient size and/or use of iterative reconstruction technique. COMPARISON:  12/06/2022 FINDINGS: Brain: Evacuation of  subdural hematoma with space primarily filled with gas. Midline shift is significantly improved, now measuring 6 mm. No evidence of complicating infarct or hydrocephalus. Vascular: No hyperdense vessel or unexpected calcification. Skull: Unremarkable craniotomy site with drain. Sinuses/Orbits: Negative IMPRESSION: Evacuated subdural hematoma with improved midline shift now measuring 6 mm. The residual space contains gas with no recurrent hemorrhage. Electronically Signed   By: Tiburcio Pea M.D.   On: 12/07/2022 09:29   CT Cervical Spine Wo Contrast  Result Date: 12/06/2022 CLINICAL DATA:  Neck trauma, intracranial hemorrhage EXAM: CT CERVICAL SPINE WITHOUT CONTRAST TECHNIQUE: Multidetector CT imaging of the cervical spine was performed without intravenous contrast. Multiplanar CT image reconstructions were also generated. RADIATION DOSE REDUCTION: This exam was performed according to the departmental dose-optimization program which includes automated exposure control, adjustment of the mA and/or kV according to patient size and/or use of iterative reconstruction technique. COMPARISON:  09/19/2022 FINDINGS: Alignment: Alignment is grossly anatomic. Skull base and vertebrae: No acute fracture. No primary bone lesion or focal pathologic process. Soft tissues and spinal canal: No prevertebral fluid or swelling. No visible canal hematoma. Disc levels:  Mild spondylosis at C5-6 and C6-7 unchanged. Upper chest: Endotracheal tube and enteric catheter are identified. Lung apices are clear. Other: Reconstructed images demonstrate no additional findings. Please see preceding head CT  describing left-sided subdural hematoma and mass effect. IMPRESSION: 1. No acute cervical spine fracture. 2. Please see preceding head CT report describing large left subdural hematoma and associated mass effect. Electronically Signed   By: Sharlet Salina M.D.   On: 12/06/2022 17:43   DG Abdomen 1 View  Result Date: 12/06/2022 CLINICAL  DATA:  Enteric catheter placement EXAM: ABDOMEN - 1 VIEW COMPARISON:  None Available. FINDINGS: Frontal view of the lower chest and upper abdomen demonstrates enteric catheter passing below diaphragm tip and side port projecting over the gastric fundus. Bowel gas pattern is unremarkable. Endotracheal tube tip projects over the tracheal air column at the thoracic inlet. Lungs are clear. IMPRESSION: 1. Enteric catheter tip projecting over gastric fundus. Electronically Signed   By: Sharlet Salina M.D.   On: 12/06/2022 17:20   DG Chest Portable 1 View  Result Date: 12/06/2022 CLINICAL DATA:  Altered level of consciousness EXAM: PORTABLE CHEST 1 VIEW COMPARISON:  02/07/2019 FINDINGS: Single frontal view of the chest demonstrates endotracheal tube overlying tracheal air column tip at thoracic inlet. Enteric catheter passes below diaphragm tip excluded by collimation. Cardiac silhouette is unremarkable. No acute airspace disease, effusion, or pneumothorax. No acute bony abnormalities. IMPRESSION: 1. No complication after intubation.  No acute process. Electronically Signed   By: Sharlet Salina M.D.   On: 12/06/2022 17:20   CT Head Wo Contrast  Result Date: 12/06/2022 CLINICAL DATA:  Mental status change, unknown cause. EXAM: CT HEAD WITHOUT CONTRAST TECHNIQUE: Contiguous axial images were obtained from the base of the skull through the vertex without intravenous contrast. RADIATION DOSE REDUCTION: This exam was performed according to the departmental dose-optimization program which includes automated exposure control, adjustment of the mA and/or kV according to patient size and/or use of iterative reconstruction technique. COMPARISON:  CTA head/neck 09/19/2022. FINDINGS: Brain: Large left frontoparietal convexity subdural hematoma measuring up to 20 mm in the coronal plane (coronal image 36 series 5) with 18 mm of rightward midline shift and left uncal herniation with torsion of the brainstem and entrapment of the  lateral ventricles at the level of the third ventricle. Partial effacement of the suprasellar and prepontine cisterns. No tonsillar herniation at this time. Vascular: No hyperdense vessel or unexpected calcification. Skull: No calvarial fracture or suspicious bone lesion. Skull base is unremarkable. Sinuses/Orbits: Unremarkable. Other: None. IMPRESSION: Large left frontoparietal convexity subdural hematoma measuring up to 20 mm in the coronal plane with 18 mm of rightward midline shift and left uncal herniation with torsion of the brainstem and entrapment of the lateral ventricles at the level of the third ventricle. Critical Value/emergent results were called by telephone at the time of interpretation on 12/06/2022 at 4:10 pm to provider [facility] , who verbally acknowledged these results. Electronically Signed   By: Orvan Falconer M.D.   On: 12/06/2022 16:14    Assessment/Plan: S/p L crani for SDH, improving neurologically - continue supportive care  GHALI MORISSETTE 12/08/2022, 10:41 AM

## 2022-12-08 NOTE — Procedures (Signed)
Extubation Procedure Note  Patient Details:   Name: Joshua Bishop DOB: [birth year] MRN: [id number]   Airway Documentation:    Vent end date: 12/08/22 Vent end time: 0855   Evaluation  O2 sats: stable throughout Complications: No apparent complications Patient did tolerate procedure well. Bilateral Breath Sounds: Clear   Yes  Pt was extubated per MD order and placed on 3 L [place]. Cuff leak was noted prior to extubation and no stridor post. Pt is stable at this time. RT will monitor.  Merlene Laughter 12/08/2022, 9:03 AM

## 2022-12-08 NOTE — Plan of Care (Signed)
  Problem: Safety: Goal: Non-violent Restraint(s) Outcome: Progressing   Problem: Education: Goal: Knowledge of the prescribed therapeutic regimen will improve Outcome: Progressing   Problem: Clinical Measurements: Goal: Usual level of consciousness will be regained or maintained. Outcome: Progressing Goal: Neurologic status will improve Outcome: Progressing Goal: Ability to maintain intracranial pressure will improve Outcome: Progressing   Problem: Skin Integrity: Goal: Demonstration of wound healing without infection will improve Outcome: Progressing   Problem: Education: Goal: Knowledge of General Education information will improve Description: Including pain rating scale, medication(s)/side effects and non-pharmacologic comfort measures Outcome: Progressing   Problem: Health Behavior/Discharge Planning: Goal: Ability to manage health-related needs will improve Outcome: Progressing   Problem: Clinical Measurements: Goal: Ability to maintain clinical measurements within normal limits will improve Outcome: Progressing Goal: Will remain free from infection Outcome: Progressing Goal: Diagnostic test results will improve Outcome: Progressing Goal: Respiratory complications will improve Outcome: Progressing Goal: Cardiovascular complication will be avoided Outcome: Progressing   Problem: Activity: Goal: Risk for activity intolerance will decrease Outcome: Progressing   Problem: Nutrition: Goal: Adequate nutrition will be maintained Outcome: Progressing   Problem: Skin Integrity: Goal: Risk for impaired skin integrity will decrease Outcome: Progressing   Problem: Coping: Goal: Level of anxiety will decrease Outcome: Not Progressing   Problem: Elimination: Goal: Will not experience complications related to bowel motility Outcome: Not Progressing Goal: Will not experience complications related to urinary retention Outcome: Not Progressing   Problem: Pain  Managment: Goal: General experience of comfort will improve Outcome: Not Progressing   Problem: Safety: Goal: Ability to remain free from injury will improve Outcome: Not Progressing

## 2022-12-08 NOTE — Progress Notes (Signed)
NAME:  Joshua Bishop, MRN:  [id number], DOB:  12/29/1980, LOS: 2 ADMISSION DATE:  12/06/2022, CONSULTATION DATE: 12/06/2022 REFERRING MD:  Linwood Dibbles, MD , CHIEF COMPLAINT: Altered mental status  History of Present Illness:  Patient is intubated and sedated, most of the history is taken from chart review and from patient's mother.  42 year old male with history of anxiety/panic attacks, recent admission for traumatic subarachnoid hemorrhage in April 2024 who was brought into the emergency department by her mother due to altered mental status for last 2 to 3 days.  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  Patient and mother urged him to go to hospital but he was refusing today he was noted to be severely altered with decreased responsiveness, his mother called EMS and he was brought to the emergency department. In the ED patient was noted to be agitated, restless, trying to take out his IV lines, he was sent for CT head which was initially unable to be completed because of restlessness and agitation, he was given Valium 10 mg without much help, he was intubated for airway protection, head CT showed large frontoparietal left-sided subacute subdural hematoma with severe cerebral edema and left-sided uncal herniation.  Neurosurgery was consulted, patient is going to OR for evacuation of subdural hematoma and possible bur holes.  PCCM was consulted for help evaluation medical management and admission to ICU  Of note patient has family history of Huntington's chorea, his father died from this illness  Pertinent  Medical History   Past Medical History:  Diagnosis Date   Panic attack      Significant Hospital Events: Including procedures, antibiotic start and stop dates in addition to other pertinent events     Interim History / Subjective:  No overnight issues. Partner at bedside. Patient  agitated and wants tube out  Objective   Blood pressure (!) 102/56, pulse (!) 55, temperature 98.6 F (37 C), temperature source Bladder, resp. rate 16, height 5\' 10"  (1.778 m), weight 74.8 kg, SpO2 100 %.    Vent Mode: PSV;CPAP FiO2 (%):  [40 %] 40 % Set Rate:  [16 bmp] 16 bmp Vt Set:  [580 mL] 580 mL PEEP:  [5 cmH20] 5 cmH20 Pressure Support:  [5 cmH20] 5 cmH20 Plateau Pressure:  [13 cmH20-16 cmH20] 15 cmH20   Intake/Output Summary (Last 24 hours) at 12/08/2022 1002 Last data filed at 12/08/2022 0700 Gross per 24 hour  Intake 3908.15 ml  Output 1530 ml  Net 2378.15 ml   Filed Weights   12/06/22 1800  Weight: 74.8 kg    Examination:   Physical exam: Gen:      Intubated, sedated, acutely ill appearing HEENT:  ETT to vent, EVD in place Lungs:    sounds of mechanical ventilation auscultated no wheeze CV:         tachycardic, regular Abd:      + bowel sounds; soft, non-tender; no palpable masses, no distension Ext:    No edema Skin:      Warm and dry; no rashes Neuro:   awake and alert, agitated on propofol and fentanyl    Labs and images were reviewed ABG reviewed this am - no acid/abse disturbance Na 141 K 4.2 Hgb 12.2  Resolved Hospital Problem list     Assessment & Plan:  Left large subacute subdural hematoma Cerebral edema with brain compression, midline shift of 18  mm Left-sided uncal herniation POD 1 left hemicraniectomy for SDH evacuation and bur hole placement Nsgy following. Repeat head ct stable Continue neuro watch every 2 hours He is getting mannitol now Keep head end of the bed elevated between 30-45 degrees, in neutral position Avoid fever and seizure Continue Keppra 500 mg twice daily for seizure prophylaxis as subdural hematoma poses a high risk of seizures Repeat imaging deferred to neurosurgery S/p hypertonic saline  Acute respiratory insufficiency Acute encephalopathy due to subdural hematoma Patient was unable to protect his airway in  the setting of altered mental status polysubstance abuse He was intubated Continue lung protective ventilation VAP prevention bundle in place Plan to extubate today on precedex, stop propofol and fentanyl  Polysubstance abuse Patient's UDS is positive for cocaine and benzos Watch for signs of withdrawal  Best Practice (right click and "Reselect all SmartList Selections" daily)   Diet/type: NPO will need bedside wallow eval DVT prophylaxis: SCD GI prophylaxis: H2B Lines: N/A Foley:  Yes, and it is still needed Code Status:  full code Last date of multidisciplinary goals of care discussion [6/23: partner updated at bedside]  The patient is critically ill due to respiratory failure, encephalopathy.  Critical care was necessary to treat or prevent imminent or life-threatening deterioration.  Critical care was time spent personally by me on the following activities: development of treatment plan with patient and/or surrogate as well as nursing, discussions with consultants, evaluation of patient's response to treatment, examination of patient, obtaining history from patient or surrogate, ordering and performing treatments and interventions, ordering and review of laboratory studies, ordering and review of radiographic studies, pulse oximetry, re-evaluation of patient's condition and participation in multidisciplinary rounds.   Critical Care Time devoted to patient care services described in this note is 40 minutes. This time reflects time of care of this signee Charlott Holler . This critical care time does not reflect separately billable procedures or procedure time, teaching time or supervisory time of PA/NP/Med student/Med Resident etc but could involve care discussion time.       Mickel Baas Pulmonary and Critical Care Medicine 12/08/2022 10:03 AM  Pager: see AMION  If no response to pager , please call critical care on call (see AMION) until 7pm After 7:00 pm call Elink

## 2022-12-09 ENCOUNTER — Encounter (HOSPITAL_COMMUNITY): Payer: Self-pay | Admitting: Neurosurgery

## 2022-12-09 DIAGNOSIS — S065XAA Traumatic subdural hemorrhage with loss of consciousness status unknown, initial encounter: Secondary | ICD-10-CM | POA: Diagnosis not present

## 2022-12-09 LAB — GLUCOSE, CAPILLARY
Glucose-Capillary: 102 mg/dL — ABNORMAL HIGH (ref 70–99)
Glucose-Capillary: 103 mg/dL — ABNORMAL HIGH (ref 70–99)
Glucose-Capillary: 105 mg/dL — ABNORMAL HIGH (ref 70–99)
Glucose-Capillary: 115 mg/dL — ABNORMAL HIGH (ref 70–99)
Glucose-Capillary: 140 mg/dL — ABNORMAL HIGH (ref 70–99)
Glucose-Capillary: 92 mg/dL (ref 70–99)

## 2022-12-09 MED ORDER — LORAZEPAM 2 MG/ML PO CONC: 1.0000 mg | Freq: Once | ORAL | Status: DC

## 2022-12-09 MED ORDER — ENSURE ENLIVE PO LIQD
237.0000 mL | Freq: Three times a day (TID) | ORAL | Status: DC
  Administered 2022-12-09 – 2022-12-18 (×22): 237 mL via ORAL

## 2022-12-09 MED ORDER — QUETIAPINE FUMARATE 25 MG PO TABS
25.0000 mg | ORAL_TABLET | Freq: Two times a day (BID) | ORAL | Status: DC
  Administered 2022-12-09 (×2): 25 mg via ORAL
  Filled 2022-12-09 (×3): qty 1

## 2022-12-09 MED ORDER — LORAZEPAM 1 MG PO TABS
1.0000 mg | ORAL_TABLET | ORAL | Status: AC | PRN
  Administered 2022-12-11: 1 mg via ORAL
  Administered 2022-12-11: 2 mg via ORAL
  Filled 2022-12-09 (×2): qty 1
  Filled 2022-12-09: qty 2

## 2022-12-09 MED ORDER — THIAMINE MONONITRATE 100 MG PO TABS
100.0000 mg | ORAL_TABLET | Freq: Every day | ORAL | Status: DC
  Administered 2022-12-09 – 2022-12-17 (×7): 100 mg via ORAL
  Filled 2022-12-09 (×8): qty 1

## 2022-12-09 MED ORDER — SODIUM CHLORIDE 0.9 % IV SOLN
260.0000 mg | Freq: Once | INTRAVENOUS | Status: AC
  Administered 2022-12-09: 260 mg via INTRAVENOUS
  Filled 2022-12-09: qty 2

## 2022-12-09 MED ORDER — SODIUM CHLORIDE 0.9 % IV SOLN
1000.0000 mL | INTRAVENOUS | Status: DC
  Administered 2022-12-09 – 2022-12-10 (×3): 1000 mL via INTRAVENOUS

## 2022-12-09 MED ORDER — LORAZEPAM 2 MG/ML IJ SOLN
1.0000 mg | INTRAMUSCULAR | Status: AC | PRN
  Administered 2022-12-09: 2 mg via INTRAVENOUS
  Administered 2022-12-09: 4 mg via INTRAVENOUS
  Administered 2022-12-10 – 2022-12-11 (×4): 2 mg via INTRAVENOUS
  Administered 2022-12-11 – 2022-12-12 (×4): 4 mg via INTRAVENOUS
  Administered 2022-12-12: 2 mg via INTRAVENOUS
  Filled 2022-12-09 (×3): qty 1
  Filled 2022-12-09: qty 2
  Filled 2022-12-09: qty 1
  Filled 2022-12-09 (×2): qty 2
  Filled 2022-12-09 (×4): qty 1

## 2022-12-09 MED ORDER — FOLIC ACID 1 MG PO TABS
1.0000 mg | ORAL_TABLET | Freq: Every day | ORAL | Status: DC
  Administered 2022-12-09 – 2022-12-17 (×8): 1 mg via ORAL
  Filled 2022-12-09 (×8): qty 1

## 2022-12-09 MED ORDER — LORAZEPAM 2 MG/ML IJ SOLN: 1.0000 mg | Freq: Once | INTRAMUSCULAR | Status: AC

## 2022-12-09 MED ORDER — THIAMINE HCL 100 MG/ML IJ SOLN
100.0000 mg | Freq: Every day | INTRAMUSCULAR | Status: DC
  Administered 2022-12-11 – 2022-12-12 (×2): 100 mg via INTRAVENOUS
  Filled 2022-12-09 (×2): qty 2

## 2022-12-09 MED ORDER — LORAZEPAM 2 MG/ML IJ SOLN
INTRAMUSCULAR | Status: AC
  Administered 2022-12-09: 1 mg
  Filled 2022-12-09: qty 1

## 2022-12-09 MED ORDER — ACETAMINOPHEN 325 MG PO TABS
650.0000 mg | ORAL_TABLET | Freq: Four times a day (QID) | ORAL | Status: DC | PRN
  Administered 2022-12-09 – 2022-12-16 (×5): 650 mg via ORAL
  Filled 2022-12-09 (×5): qty 2

## 2022-12-09 NOTE — Progress Notes (Signed)
eLink Physician-Brief Progress Note Patient Name: Cal Gindlesperger Fryer DOB: Jun 05, 1981 MRN: [id number]   Date of Service  12/09/2022  HPI/Events of Note  Notified of urinary retention with bladder scan showing >800cc of urine.   eICU Interventions  Straight cath ordered.     Intervention Category Intermediate Interventions: Other:  Larinda Buttery 12/09/2022, 11:31 PM

## 2022-12-09 NOTE — TOC Initial Note (Signed)
Transition of Care [facility]) - Initial/Assessment Note    Patient Details  Name: Joshua Bishop MRN: [id number] Date of Birth: Oct 13, 1980  [facility]) CM/SW Contact:    Mearl Latin, LCSW Phone Number: 12/09/2022, 5:32 PM  Clinical Narrative:                 Patient admitted from home. CSW received consult for cocaine use. CSW will assess with patient when more oriented.     Barriers to Discharge: Continued Medical Work up   Patient Goals and CMS Choice            Expected Discharge Plan and Services In-house Referral: Clinical Social Work     Living arrangements for the past 2 months: Single Family Home                                      Prior Living Arrangements/Services Living arrangements for the past 2 months: Single Family Home   Patient language and need for interpreter reviewed:: Yes              Criminal Activity/Legal Involvement Pertinent to Current Situation/Hospitalization: No - Comment as needed  Activities of Daily Living      Permission Sought/Granted                  Emotional Assessment       Orientation: : Oriented to Self, Oriented to Place, Oriented to Situation Alcohol / Substance Use: Illicit Drugs Psych Involvement: No (comment)  Admission diagnosis:  Cocaine intoxication delirium (HCC) [F14.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 [place] [address] Phone: [phone] Fax: [phone]     Social Determinants of Health (SDOH) Social History: SDOH Screenings    Tobacco Use: High Risk (12/09/2022)   SDOH Interventions:     Readmission Risk Interventions     No data to display

## 2022-12-09 NOTE — Progress Notes (Signed)
Subjective: NAEs o/n  Objective: Vital signs in last 24 hours: Temp:  [98.1 F (36.7 C)-99.4 F (37.4 C)] 98.2 F (36.8 C) (06/24 1200) Pulse Rate:  [51-86] 67 (06/24 1200) Resp:  [15-26] 22 (06/24 1200) BP: (111-147)/(72-99) 124/84 (06/24 1200) SpO2:  [89 %-99 %] 95 % (06/24 1200) Weight:  [81 kg] 81 kg (06/24 0400)  Intake/Output from previous day: 06/23 0701 - 06/24 0700 In: 3818.3 [I.V.:3387.3; NG/GT:105; IV Piggyback:326] Out: 6140 [Urine:6140] Intake/Output this shift: Total I/O In: 136.9 [I.V.:136.9] Out: 800 [Urine:800]  Oriented to person, place Speech fluent Restless, agitated.  CN grossly intact Wound c/d/i   Lab Results: Recent Labs    12/06/22 1433 12/06/22 1618 12/07/22 0439 12/07/22 0524  WBC 12.0*  --  7.9  --   HGB 14.6   < > 12.3* 12.2*  HCT 43.0   < > 37.0* 36.0*  PLT 259  --  211  --    < > = values in this interval not displayed.   BMET Recent Labs    12/06/22 1433 12/06/22 1618 12/07/22 0439 12/07/22 0524  NA 137   < > 140 141  K 3.9   < > 4.2 4.2  CL 104  --  107  --   CO2 23  --  24  --   GLUCOSE 130*  --  135*  --   BUN 9  --  11  --   CREATININE 0.79  --  0.84  --   CALCIUM 9.1  --  8.8*  --    < > = values in this interval not displayed.    Studies/Results: No results found.  Assessment/Plan: S/p L crani for SDH evacuation 12/06/22.   -Continue supportive care -Ativan 1mg  ordered for agitation, consider CIWA protocol.  -Call w/ questions/concerns.    Kalilah Barua CAYLIN Jaasia Viglione 12/09/2022, 12:45 PM

## 2022-12-09 NOTE — Progress Notes (Signed)
Contacted neurosurgery regarding continued restlessness and agitation and need for PRN ativan, was worried for worsening neuro deficits. Neurosurgery, Patrici Ranks PA , was messaged regarding an updated CT. Will re-evaluate in the morning for updated CT scan per PA.

## 2022-12-09 NOTE — Progress Notes (Signed)
NAME:  Joshua Bishop, MRN:  [id number], DOB:  January 30, 1981, LOS: 3 ADMISSION DATE:  12/06/2022, CONSULTATION DATE: 12/06/2022 REFERRING MD:  Linwood Dibbles, MD , CHIEF COMPLAINT: Altered mental status  History of Present Illness:  Patient is intubated and sedated, most of the history is taken from chart review and from patient's mother.  42 year old male with history of anxiety/panic attacks, recent admission for traumatic subarachnoid hemorrhage in April 2024 who was brought into the emergency department by her mother due to altered mental status for last 2 to 3 days.  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  Patient and mother urged him to go to hospital but he was refusing today he was noted to be severely altered with decreased responsiveness, his mother called EMS and he was brought to the emergency department. In the ED patient was noted to be agitated, restless, trying to take out his IV lines, he was sent for CT head which was initially unable to be completed because of restlessness and agitation, he was given Valium 10 mg without much help, he was intubated for airway protection, head CT showed large frontoparietal left-sided subacute subdural hematoma with severe cerebral edema and left-sided uncal herniation.  Neurosurgery was consulted, patient is going to OR for evacuation of subdural hematoma and possible bur holes.  PCCM was consulted for help evaluation medical management and admission to ICU  Of note patient has family history of Huntington's chorea, his father died from this illness  Pertinent  Medical History   Past Medical History:  Diagnosis Date   Panic attack      Significant Hospital Events: Including procedures, antibiotic start and stop dates in addition to other pertinent events     Interim History / Subjective:  Extubated successfully. NAEON On precedex this AM,  will attempt to wean  Objective   Blood pressure (!) 137/96, pulse (!) 55, temperature 98.4 F (36.9 C), temperature source Axillary, resp. rate (!) 21, height 5\' 10"  (1.778 m), weight 81 kg, SpO2 97 %.    Vent Mode: PSV;CPAP FiO2 (%):  [40 %] 40 % PEEP:  [5 cmH20] 5 cmH20 Pressure Support:  [5 cmH20] 5 cmH20   Intake/Output Summary (Last 24 hours) at 12/09/2022 0758 Last data filed at 12/09/2022 0700 Gross per 24 hour  Intake 3818.33 ml  Output 6140 ml  Net -2321.67 ml    Filed Weights   12/06/22 1800 12/09/22 0400  Weight: 74.8 kg 81 kg    Examination: General: Adult male, resting in bed, in NAD. Neuro: Sedated but opens eyes to voice and follows basic commands. HEENT: [place]/AT. Sclerae anicteric. EOMI. Cardiovascular: RRR, no M/R/G.  Lungs: Respirations even and unlabored.  CTA bilaterally, No W/R/R. Abdomen: BS x 4, soft, NT/ND.  Musculoskeletal: No gross deformities, no edema.  Skin: Intact, warm, no rashes.  Assessment & Plan:  Left large subacute subdural hematoma Cerebral edema with brain compression, midline shift of 18 mm - s/p mannitol and 3% NS Left-sided uncal herniation S/p left hemicraniectomy for SDH evacuation and bur hole placement 6/21 Nsgy following Continue Keppra 500 mg twice daily for seizure prophylaxis as subdural hematoma poses a high risk of seizures Repeat imaging deferred to neurosurgery PT/OT eval  Acute respiratory insufficiency - s/p intubation 6/21 with subsequent extubation 6/23 Acute encephalopathy due to subdural hematoma Continue bronchial hygiene Ambulate as able  Polysubstance abuse Patient's UDS is  positive for cocaine and benzos Watch for signs of withdrawal  Best Practice (right click and "Reselect all SmartList Selections" daily)   CC time: 35 min.   Rutherford Guys, PA - [facility] [place] [facility] For pager details, please see AMION or use Epic chat  After 1900, please call ELINK for cross coverage  needs 12/09/2022, 8:07 AM

## 2022-12-09 NOTE — Progress Notes (Signed)
OT Cancellation Note  Patient Details Name: Joshua Bishop MRN: [id number] DOB: 03/22/1981   Cancelled Treatment:    Reason Eval/Treat Not Completed: Patient not medically ready (increased sedation for patient)  Mateo Flow 12/09/2022, 2:11 PM

## 2022-12-09 NOTE — Progress Notes (Signed)
Nutrition Follow-up  DOCUMENTATION CODES:   Not applicable  INTERVENTION:   Ensure Enlive po TID, each supplement provides 350 kcal and 20 grams of protein.  Encourage PO intake   NUTRITION DIAGNOSIS:   Inadequate oral intake related to inability to eat as evidenced by NPO status. Ongoing.   GOAL:   Patient will meet greater than or equal to 90% of their needs Progressing post extubation with diet advancement  MONITOR:   Vent status, Labs, Weight trends, TF tolerance  REASON FOR ASSESSMENT:   Ventilator, Consult Enteral/tube feeding initiation and management  ASSESSMENT:   42 year old male who presented to the ED on 6/21 with AMS. PMH of Huntington's, EtOH abuse, polysubstance abuse, anxiety, recent admission for traumatic subarachnoid hemorrhage. Pt required intubation. Pt admitted with large L subacute SDH, cerebral edema with brain compression, midline shift, and left-sided uncal herniation.  06/21 - s/p L craniotomy for evacuation of hematoma 06/23 - extubated  Pt working with staff at time of visit.  Per MD pt will remain in ICU due to agitation, need for precedex.   Medications reviewed and include: pepcid, SSI q 4 hours, MVI with minerals, protonix Precedex  Labs reviewed:  CBG's: 103 x 24 hours    Diet Order:   Diet Order             Diet regular Room service appropriate? Yes with Assist; Fluid consistency: Thin  Diet effective now                   EDUCATION NEEDS:   No education needs have been identified at this time  Skin:  Skin Assessment: Skin Integrity Issues: Incisions: head  Last BM:  no documented BM  Height:   Ht Readings from Last 1 Encounters:  12/06/22 5\' 10"  (1.778 m)    Weight:   Wt Readings from Last 1 Encounters:  12/09/22 81 kg    BMI:  Body mass index is 25.62 kg/m.  Estimated Nutritional Needs:   Kcal:  2200-2400  Protein:  105-120 grams  Fluid:  >2.0 L  Loris Seelye P., RD, LDN, CNSC See AMiON for  contact information

## 2022-12-09 NOTE — Progress Notes (Signed)
Physician (Dr. Levon Hedger) notified at 1330PM of continuous bouts of agitation and restlessness. Patient pulled out two IV,s and continuing to stand out of bed without assistance. Bed alarm has been on. Physician ordered CIWA with ativan PRN q1 when needed. Soft belt restraint and soft wrist restraints also ordered and protocol began for charting.

## 2022-12-09 NOTE — Progress Notes (Signed)
PT Cancellation Note  Patient Details Name: Joshua Bishop MRN: [id number] DOB: 1980/07/30   Cancelled Treatment:    Reason Eval/Treat Not Completed: Fatigue/lethargy limiting ability to participate. Pt agitated and restless, RN has given multiple sedating medicines and recommend PT hold evaluation until pt is more alert.   Vickki Muff, PT, DPT   Acute Rehabilitation Department Office [phone] Secure Chat Communication Preferred   Ronnie Derby 12/09/2022, 2:55 PM

## 2022-12-10 DIAGNOSIS — S065XAA Traumatic subdural hemorrhage with loss of consciousness status unknown, initial encounter: Secondary | ICD-10-CM | POA: Diagnosis not present

## 2022-12-10 LAB — BASIC METABOLIC PANEL
Anion gap: 10 (ref 5–15)
BUN: 8 mg/dL (ref 6–20)
CO2: 24 mmol/L (ref 22–32)
Calcium: 8.7 mg/dL — ABNORMAL LOW (ref 8.9–10.3)
Chloride: 102 mmol/L (ref 98–111)
Creatinine, Ser: 0.72 mg/dL (ref 0.61–1.24)
GFR, Estimated: >60 mL/min (ref >60)
Glucose, Bld: 103 mg/dL — ABNORMAL HIGH (ref 70–99)
Potassium: 3.6 mmol/L (ref 3.5–5.1)
Sodium: 136 mmol/L (ref 135–145)

## 2022-12-10 LAB — CBC
HCT: 37.3 % — ABNORMAL LOW (ref 39.0–52.0)
Hemoglobin: 13.3 g/dL (ref 13.0–17.0)
MCH: 30.9 pg (ref 26.0–34.0)
MCHC: 35.7 g/dL (ref 30.0–36.0)
MCV: 86.5 fL (ref 80.0–100.0)
Platelets: 228 10*3/uL (ref 150–400)
RBC: 4.31 MIL/uL (ref 4.22–5.81)
RDW: 12 % (ref 11.5–15.5)
WBC: 7 10*3/uL (ref 4.0–10.5)
nRBC: 0 % (ref 0.0–0.2)

## 2022-12-10 LAB — PHOSPHORUS: Phosphorus: 3.4 mg/dL (ref 2.5–4.6)

## 2022-12-10 LAB — GLUCOSE, CAPILLARY
Glucose-Capillary: 107 mg/dL — ABNORMAL HIGH (ref 70–99)
Glucose-Capillary: 117 mg/dL — ABNORMAL HIGH (ref 70–99)
Glucose-Capillary: 118 mg/dL — ABNORMAL HIGH (ref 70–99)
Glucose-Capillary: 120 mg/dL — ABNORMAL HIGH (ref 70–99)
Glucose-Capillary: 161 mg/dL — ABNORMAL HIGH (ref 70–99)
Glucose-Capillary: 97 mg/dL (ref 70–99)

## 2022-12-10 LAB — MAGNESIUM: Magnesium: 2.1 mg/dL (ref 1.7–2.4)

## 2022-12-10 MED ORDER — CLONIDINE HCL 0.2 MG PO TABS
0.3000 mg | ORAL_TABLET | Freq: Four times a day (QID) | ORAL | Status: DC
  Administered 2022-12-10 – 2022-12-11 (×4): 0.3 mg via ORAL
  Filled 2022-12-10 (×4): qty 1

## 2022-12-10 MED ORDER — QUETIAPINE FUMARATE 100 MG PO TABS
100.0000 mg | ORAL_TABLET | Freq: Two times a day (BID) | ORAL | Status: DC
  Administered 2022-12-10 – 2022-12-12 (×5): 100 mg via ORAL
  Filled 2022-12-10 (×5): qty 1

## 2022-12-10 MED ORDER — QUETIAPINE FUMARATE 25 MG PO TABS: 50.0000 mg | ORAL_TABLET | Freq: Two times a day (BID) | ORAL | Status: DC

## 2022-12-10 MED ORDER — LORAZEPAM 2 MG/ML IJ SOLN
1.0000 mg | INTRAMUSCULAR | Status: DC
  Administered 2022-12-10 – 2022-12-11 (×4): 1 mg via INTRAVENOUS
  Filled 2022-12-10 (×4): qty 1

## 2022-12-10 NOTE — Evaluation (Signed)
Occupational Therapy Evaluation Patient Details Name: Joshua Bishop MRN: [id number] DOB: 01-Feb-1981 Today's Date: 12/10/2022   History of Present Illness 42 yo male admitted 12/06/22 due to AMS, found to have large L subacute SDH, now s/p L crani on 6/21 for evacuation of SDH.  PMH anxiety, panic attacks, 04/24 SAH.   Clinical Impression   Patient is s/p L SDH with L craniotomy surgery resulting in functional limitations due to the deficits listed below (see OT problem list). 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 Pt at this time with cognitive and balance deficits. Pt with soft voice quality at beginning of session and by the end of session with cues with increased volume.  Patient will benefit from skilled OT acutely to increase independence and safety with ADLS to allow discharge Patient will benefit from intensive inpatient follow up therapy, >3 hours/day        Recommendations for follow up therapy are one component of a multi-disciplinary discharge planning process, led by the attending physician.  Recommendations may be updated based on patient status, additional functional criteria and insurance authorization.   Assistance Recommended at Discharge    Patient can return home with the following A lot of help with bathing/dressing/bathroom;A little help with walking and/or transfers;Assist for transportation    Functional Status Assessment  Patient has had a recent decline in their functional status and demonstrates the ability to make significant improvements in function in a reasonable and predictable amount of time.  Equipment Recommendations  Other (comment) (TBA)    Recommendations for Other Services Rehab consult     Precautions / Restrictions Precautions Precautions: Fall Precaution Comments: Posey belt, wrist restraints, mittens CIWA Restrictions Weight Bearing Restrictions: No      Mobility Bed Mobility Overal bed mobility: Needs Assistance Bed  Mobility: Supine to Sit, Sit to Supine Rolling: Min guard   Supine to sit: Min guard Sit to supine: Mod assist, +2 for safety/equipment   General bed mobility comments: pt impulsive and exiting the bed with internal dive to void bladder but does not verbalize at all until already on feet and stopped by staff for safety reasons of lines and leads. pt unaware of balance deficits. pt with return to supine requires mod (A) for bil LE and (A) to align trunk by RN. pt verbalized not wanting restraints and becoming more irriated.    Transfers Overall transfer level: Needs assistance   Transfers: Sit to/from Stand Sit to Stand: Min assist           General transfer comment: pt static standing at eob for 10 minutes      Balance Overall balance assessment: Mild deficits observed, not formally tested Sitting-balance support: Feet supported Sitting balance-Leahy Scale: Poor       Standing balance-Leahy Scale: Poor                             ADL either performed or assessed with clinical judgement   ADL Overall ADL's : Needs assistance/impaired                 Upper Body Dressing : Minimal assistance       Toilet Transfer: Moderate assistance     Toileting - Clothing Manipulation Details (indicate cue type and reason): static standing with total A to hold urinal in place. pt appropriately voiding. pt with cues to avoid from pulling the urinal away from RN present.  General ADL Comments: pt asleep with increased time to arouse. pt with HOB increased to arouse with reposition of body into midline and increased time. pt took ~3-5 MINUTES to arouse in upright positioning with tactile input and auditory     Vision         Perception     Praxis      Pertinent Vitals/Pain Pain Assessment Pain Assessment: No/denies pain     Hand Dominance Right   Extremity/Trunk Assessment Upper Extremity Assessment Upper Extremity Assessment: Generalized  weakness   Lower Extremity Assessment Lower Extremity Assessment: Defer to PT evaluation   Cervical / Trunk Assessment Cervical / Trunk Assessment: Kyphotic   Communication Communication Communication: Expressive difficulties (soft voice volume until pt strongly did not want the mitten applied)   Cognition Arousal/Alertness: Awake/alert Behavior During Therapy: Restless Overall Cognitive Status: Impaired/Different from baseline Area of Impairment: Safety/judgement, Awareness, Problem solving                   Current Attention Level: Selective   Following Commands: Follows one step commands consistently, Follows multi-step commands inconsistently Safety/Judgement: Decreased awareness of deficits, Decreased awareness of safety Awareness: Emergent Problem Solving: Slow processing, Difficulty sequencing General Comments: pt verbalized name and dob correctly.pt reports being at [facility] and hospital correctly without cues. pt asking for phone to call family appropriate. pt becoming more restless when verbalized no phone known in room. pt impulsive standing and then states need to void. pt attempting to ambulate without awareness to lines and leads     General Comments  vss on RA    Exercises     Shoulder Instructions      Home Living Family/patient expects to be discharged to:: Private residence Living Arrangements: Parent Available Help at Discharge: Family Type of Home: House Home Access: Stairs to enter [employer] of Steps: a few   Home Layout: One level               Home Equipment: None   Additional Comments: no family present at time of OT evaluation. 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          OT Problem List: Decreased activity  tolerance;Impaired balance (sitting and/or standing);Decreased cognition;Decreased safety awareness;Decreased knowledge of use of DME or AE;Decreased knowledge of precautions;Decreased coordination      OT Treatment/Interventions: Self-care/ADL training;Therapeutic exercise;Neuromuscular education;Energy conservation;DME and/or AE instruction;Manual therapy;Modalities;Therapeutic activities;Cognitive remediation/compensation;Patient/family education;Balance training    OT Goals(Current goals can be found in the care plan section) Acute Rehab OT Goals Patient Stated Goal: to find my phone OT Goal Formulation: Patient unable to participate in goal setting Time For Goal Achievement: 12/24/22 Potential to Achieve Goals: Good  OT Frequency: Min 2X/week    Co-evaluation              AM-PAC OT "6 Clicks" Daily Activity     Outcome Measure Help from another person eating meals?: A Little Help from another person taking care of personal grooming?: A Lot Help from another person toileting, which includes using toliet, bedpan, or urinal?: A Lot Help from another person bathing (including washing, rinsing, drying)?: A Lot Help from another person to put on and taking off regular upper body clothing?: A Lot Help from another person to put  on and taking off regular lower body clothing?: A Lot 6 Click Score: 13   End of Session Equipment Utilized During Treatment: Gait belt Nurse Communication: Mobility status;Precautions  Activity Tolerance: Patient tolerated treatment well Patient left: in bed;with call bell/phone within reach;with nursing/sitter in room;with restraints reapplied  OT Visit Diagnosis: Unsteadiness on feet (R26.81);Muscle weakness (generalized) (M62.81);Cognitive communication deficit (R41.841)                Time: 1401-1420 OT Time Calculation (min): 19 min Charges:  OT General Charges $OT Visit: 1 Visit OT Evaluation $OT Eval High Complexity: 1 High   Brynn, OTR/L   Acute Rehabilitation Services Office: [phone] .   Mateo Flow 12/10/2022, 6:41 PM

## 2022-12-10 NOTE — Evaluation (Signed)
Physical Therapy Evaluation Patient Details Name: Joshua Bishop MRN: [id number] DOB: Oct 27, 1980 Today's Date: 12/10/2022  History of Present Illness  42 yo male admitted 12/06/22 due to AMS, found to have large L subacute SDH, now s/p L crani on 6/21 for evacuation of SDH.  PMH anxiety, panic attacks, 04/24 SAH.  Clinical Impression  Patient presents with decreased mobility due to decreased arousal, attention, awareness, decreased sitting and standing balance and decreased activity tolerance.  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  Currently max to total A to initiate up at EOB and pt slow to rouse.  Needing +2 A for sit to stand to use urinal.  Patient likely will progress and mobilize well, though current recommendation for intensive inpatient rehab prior to d/c home.  PT will continue to follow in acute setting.      Recommendations for follow up therapy are one component of a multi-disciplinary discharge planning process, led by the attending physician.  Recommendations may be updated based on patient status, additional functional criteria and insurance authorization.  Follow Up Recommendations       Assistance Recommended at Discharge Intermittent Supervision/Assistance  Patient can return home with the following  A lot of help with bathing/dressing/bathroom;A lot of help with walking and/or transfers;Help with stairs or ramp for entrance;Assistance with feeding;Assist for transportation;Assistance with cooking/housework;Direct supervision/assist for medications management    Equipment Recommendations Other (comment) (TBA)  Recommendations for Other Services  Rehab consult    Functional Status Assessment Patient has had a recent decline in their functional status and demonstrates the ability to make significant improvements in function in a reasonable and predictable amount of time.     Precautions / Restrictions Precautions Precautions:  Fall Precaution Comments: Posey, restraints, CIWA Restrictions Weight Bearing Restrictions: No      Mobility  Bed Mobility Overal bed mobility: Needs Assistance Bed Mobility: Sidelying to Sit, Rolling, Sit to Supine Rolling: Mod assist Sidelying to sit: Total assist, +2 for physical assistance   Sit to supine: Mod assist, +2 for safety/equipment   General bed mobility comments: initially difficulty to rouse so total A for up to EOB and pt returning to supine due to heavy lift but then initiated more on second attempt; to supine assist for legs, trunk and positioning in bed (brother present and assisting)    Transfers Overall transfer level: Needs assistance Equipment used: 2 person hand held assist Transfers: Sit to/from Stand Sit to Stand: Mod assist, +2 physical assistance, From elevated surface           General transfer comment: initial attempts pt not assisting much, but after several attempts able to stand with cues pt able to stand erect and even trying to hold his own urinal    Ambulation/Gait               General Gait Details: NT dropped urinal on the floor and needed to clean  Stairs            Wheelchair Mobility    Modified Rankin (Stroke Patients Only)       Balance Overall balance assessment: Needs assistance Sitting-balance support: Feet supported Sitting balance-Leahy Scale: Poor Sitting balance - Comments: initially mod to max A for sitting balance, as more alert pt with min to mod A for sitting EOB   Standing balance support: Bilateral upper extremity supported Standing balance-Leahy Scale: Zero Standing balance comment: +2 A for standing  Pertinent Vitals/Pain Pain Assessment Pain Assessment: No/denies pain    Home Living Family/patient expects to be discharged to:: Private residence Living Arrangements: Parent Available Help at Discharge: Family Type of Home: House Home Access:  Stairs to enter   Secretary/administrator of Steps: a few   Home Layout: One level Home Equipment: None      Prior Function Prior Level of Function : Independent/Modified Independent             Mobility Comments: 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       Higher education careers adviser        Extremity/Trunk Assessment   Upper Extremity Assessment Upper Extremity Assessment: Generalized weakness    Lower Extremity Assessment Lower Extremity Assessment: Generalized weakness    Cervical / Trunk Assessment Cervical / Trunk Assessment: Kyphotic  Communication   Communication: Expressive difficulties (dysrthria)  Cognition Arousal/Alertness: Lethargic, Suspect due to medications Behavior During Therapy: Restless Overall Cognitive Status: Impaired/Different from baseline Area of Impairment: Attention, Following commands, Safety/judgement, Problem solving                   Current Attention Level: Sustained   Following Commands: Follows one step commands with increased time, Follows one step commands inconsistently Safety/Judgement: Decreased awareness of safety, Decreased awareness of deficits   Problem Solving: Slow processing, Difficulty sequencing General Comments: very lethargic and difficulty to rouse initially, slowly able to rouse and indicated need to urinate, somewhat impulsive and needing A for safety though did not initiate pulling lines or combative during session; brother present to assist and orient        General Comments General comments (skin integrity, edema, etc.): VSS during mobility at EOB, able to urinate in standing    Exercises     Assessment/Plan    PT Assessment Patient needs continued PT services  PT Problem List Decreased strength;Decreased balance;Decreased cognition;Decreased activity tolerance;Decreased coordination;Decreased safety awareness       PT Treatment Interventions DME instruction;Functional  mobility training;Balance training;Patient/family education;Therapeutic activities;Gait training;Stair training;Therapeutic exercise    PT Goals (Current goals can be found in the Care Plan section)  Acute Rehab PT Goals Patient Stated Goal: to return to independent PT Goal Formulation: With patient/family Time For Goal Achievement: 12/24/22 Potential to Achieve Goals: Good    Frequency Min 4X/week     Co-evaluation               AM-PAC PT "6 Clicks" Mobility  Outcome Measure Help needed turning from your back to your side while in a flat bed without using bedrails?: Total Help needed moving from lying on your back to sitting on the side of a flat bed without using bedrails?: Total Help needed moving to and from a bed to a chair (including a wheelchair)?: Total Help needed standing up from a chair using your arms (e.g., wheelchair or bedside chair)?: Total Help needed to walk in hospital room?: Total Help needed climbing 3-5 steps with a railing? : Total 6 Click Score: 6    End of Session Equipment Utilized During Treatment: Gait belt Activity Tolerance: Patient limited by lethargy Patient left: in bed;with call bell/phone within reach;with family/visitor present Nurse Communication: Mobility status PT Visit Diagnosis: Other abnormalities of gait and mobility (R26.89);Muscle weakness (generalized) (M62.81);Difficulty in walking, not elsewhere classified (R26.2);Other symptoms and signs involving the nervous system (R29.898)    Time: 1610-9604 PT Time Calculation (min) (ACUTE ONLY): 28 min   Charges:   PT Evaluation $PT  Eval Moderate Complexity: 1 Mod PT Treatments $Therapeutic Activity: 8-22 mins        Sheran Lawless, PT Acute Rehabilitation Services Office:[phone] 12/10/2022   Elray Mcgregor 12/10/2022, 6:02 PM

## 2022-12-10 NOTE — Progress Notes (Signed)
NAME:  ARDIS FULLWOOD, MRN:  [id number], DOB:  05-Jun-1981, LOS: 4 ADMISSION DATE:  12/06/2022, CONSULTATION DATE: 12/06/2022 REFERRING MD:  Linwood Dibbles, MD , CHIEF COMPLAINT: Altered mental status  History of Present Illness:  Patient is intubated and sedated, most of the history is taken from chart review and from patient's mother.  42 year old male with history of anxiety/panic attacks, recent admission for traumatic subarachnoid hemorrhage in April 2024 who was brought into the emergency department by her mother due to altered mental status for last 2 to 3 days.  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  Patient and mother urged him to go to hospital but he was refusing today he was noted to be severely altered with decreased responsiveness, his mother called EMS and he was brought to the emergency department. In the ED patient was noted to be agitated, restless, trying to take out his IV lines, he was sent for CT head which was initially unable to be completed because of restlessness and agitation, he was given Valium 10 mg without much help, he was intubated for airway protection, head CT showed large frontoparietal left-sided subacute subdural hematoma with severe cerebral edema and left-sided uncal herniation.  Neurosurgery was consulted, patient is going to OR for evacuation of subdural hematoma and possible bur holes.  PCCM was consulted for help evaluation medical management and admission to ICU  Of note patient has family history of Huntington's chorea, his father died from this illness  Pertinent  Medical History   Past Medical History:  Diagnosis Date   Panic attack      Significant Hospital Events: Including procedures, antibiotic start and stop dates in addition to other pertinent events     Interim History / Subjective:  NAEON. Remains on Precedex this AM with intermittent  agitation. Phenobarb rebolused yesterday and Seroquel started. Also required Ativan per CIWA yesterday afternoon.  Objective   Blood pressure (!) 132/92, pulse 68, temperature 99.4 F (37.4 C), temperature source Axillary, resp. rate 20, height 5\' 10"  (1.778 m), weight 77.2 kg, SpO2 94 %.        Intake/Output Summary (Last 24 hours) at 12/10/2022 0801 Last data filed at 12/10/2022 0700 Gross per 24 hour  Intake 1883.56 ml  Output 3450 ml  Net -1566.44 ml    Filed Weights   12/06/22 1800 12/09/22 0400 12/10/22 0400  Weight: 74.8 kg 81 kg 77.2 kg    Examination: General: Adult male, somewhat agitated intermittently but in NAD. Neuro: Opens eyes, follows intermittent commands, intermittently agitated. HEENT: [place]/AT. Sclerae anicteric. EOMI. Cardiovascular: RRR, no M/R/G.  Lungs: Respirations even and unlabored.  CTA bilaterally, No W/R/R. Abdomen: BS x 4, soft, NT/ND.  Musculoskeletal: No gross deformities, no edema.  Skin: Intact, warm, no rashes.  Assessment & Plan:  Left large subacute subdural hematoma Cerebral edema with brain compression, midline shift of 18 mm - s/p mannitol and 3% NS Left-sided uncal herniation S/p left hemicraniectomy for SDH evacuation and bur hole placement 6/21 Nsgy following Continue Keppra 500 mg twice daily for seizure prophylaxis as subdural hematoma poses a high risk of seizures Repeat imaging deferred to neurosurgery PT/OT eval  Acute respiratory insufficiency - s/p intubation 6/21 with subsequent extubation 6/23 Acute encephalopathy due to subdural hematoma Continue bronchial hygiene Ambulate as able  Polysubstance abuse - Patient's UDS is positive for cocaine and benzos Watch for signs of  withdrawal Continue to wean Precedex as able Continue Phenobarb Continue Ativan per CIWA but add low dose scheduled Continue Seroquel, dose increased today Add Clonidine   Best Practice (right click and "Reselect all SmartList Selections" daily)    CC time: 30 min.   Rutherford Guys, PA - [facility] For pager details, please see AMION or use Epic chat  After 1900, please call ELINK for cross coverage needs 12/10/2022, 8:01 AM

## 2022-12-10 NOTE — Progress Notes (Signed)
Subjective: NAEs o/n.   Objective: Vital signs in last 24 hours: Temp:  [97.5 F (36.4 C)-100.1 F (37.8 C)] 99.4 F (37.4 C) (06/25 0300) Pulse Rate:  [49-107] 68 (06/25 0700) Resp:  [15-33] 20 (06/25 0700) BP: (117-143)/(77-99) 132/92 (06/25 0700) SpO2:  [92 %-97 %] 94 % (06/25 0700) Weight:  [77.2 kg] 77.2 kg (06/25 0400)  Intake/Output from previous day: 06/24 0701 - 06/25 0700 In: 2020.5 [I.V.:1820.5; IV Piggyback:200] Out: 3750 [Urine:3750] Intake/Output this shift: No intake/output data recorded.  Sedated, in soft restraints Wound c/d/I PERRLA MAEs x4 Speech fluent  Lab Results: Recent Labs    12/10/22 0254  WBC 7.0  HGB 13.3  HCT 37.3*  PLT 228   BMET Recent Labs    12/10/22 0254  NA 136  K 3.6  CL 102  CO2 24  GLUCOSE 103*  BUN 8  CREATININE 0.72  CALCIUM 8.7*    Studies/Results: No results found.  Assessment/Plan: S/p L crani for SDH evacuation 12/06/22.    -Continue supportive care -Call w/ questions/concerns.      Aubrianna Orchard CAYLIN Rashi Giuliani 12/10/2022, 10:11 AM

## 2022-12-11 ENCOUNTER — Encounter (HOSPITAL_COMMUNITY): Payer: Self-pay | Admitting: Internal Medicine

## 2022-12-11 DIAGNOSIS — S065XAA Traumatic subdural hemorrhage with loss of consciousness status unknown, initial encounter: Secondary | ICD-10-CM | POA: Diagnosis not present

## 2022-12-11 LAB — GLUCOSE, CAPILLARY
Glucose-Capillary: 115 mg/dL — ABNORMAL HIGH (ref 70–99)
Glucose-Capillary: 110 mg/dL — ABNORMAL HIGH (ref 70–99)
Glucose-Capillary: 77 mg/dL (ref 70–99)
Glucose-Capillary: 127 mg/dL — ABNORMAL HIGH (ref 70–99)
Glucose-Capillary: 101 mg/dL — ABNORMAL HIGH (ref 70–99)
Glucose-Capillary: 97 mg/dL (ref 70–99)

## 2022-12-11 MED ORDER — CLONIDINE HCL 0.1 MG PO TABS: 0.1000 mg | ORAL_TABLET | Freq: Two times a day (BID) | ORAL | Status: DC

## 2022-12-11 MED ORDER — CLONIDINE HCL 0.2 MG PO TABS: 0.3000 mg | ORAL_TABLET | Freq: Four times a day (QID) | ORAL | Status: DC

## 2022-12-11 MED ORDER — CLONIDINE HCL 0.2 MG PO TABS
0.3000 mg | ORAL_TABLET | Freq: Four times a day (QID) | ORAL | Status: AC
  Administered 2022-12-11 (×2): 0.3 mg via ORAL
  Filled 2022-12-11 (×2): qty 1

## 2022-12-11 MED ORDER — CLONIDINE HCL 0.2 MG PO TABS
0.2000 mg | ORAL_TABLET | Freq: Four times a day (QID) | ORAL | Status: DC
  Administered 2022-12-11: 0.2 mg via ORAL
  Filled 2022-12-11 (×2): qty 1

## 2022-12-11 MED ORDER — CLONIDINE HCL 0.2 MG PO TABS: 0.2000 mg | ORAL_TABLET | Freq: Once | ORAL | Status: DC

## 2022-12-11 MED ORDER — LORAZEPAM 1 MG PO TABS
1.0000 mg | ORAL_TABLET | ORAL | Status: DC | PRN
  Administered 2022-12-11 – 2022-12-16 (×11): 1 mg via ORAL
  Filled 2022-12-11 (×11): qty 1

## 2022-12-11 MED ORDER — CLONIDINE HCL 0.1 MG PO TABS: 0.1000 mg | ORAL_TABLET | Freq: Four times a day (QID) | ORAL | Status: DC

## 2022-12-11 MED ORDER — CLONIDINE HCL 0.2 MG PO TABS: 0.2000 mg | ORAL_TABLET | Freq: Four times a day (QID) | ORAL | Status: DC

## 2022-12-11 NOTE — Progress Notes (Signed)
Inpatient Rehab Admissions Coordinator Note:   Per therapy recommendations patient was screened for CIR candidacy by Stephania Fragmin, PT. At this time, pt appears to be a potential candidate for CIR. I will place an order for rehab consult for full assessment, per our protocol.  Please contact me any with questions.Estill Dooms, PT, DPT [phone] 12/11/22 10:08 AM

## 2022-12-11 NOTE — Progress Notes (Signed)
NAME:  Joshua Bishop, MRN:  [id number], DOB:  06/21/1980, LOS: 5 ADMISSION DATE: 12/06/2022, CONSULTATION DATE: 12/06/2022 REFERRING MD: Lynelle Doctor - EDP, CHIEF COMPLAINT: Altered mental status  History of Present Illness:  42 year old male who presented to [facility] ED 6/21 for AMS x 2-3 days. PMHx significant for anxiety/panic attacks, recent admission for traumatic [facility] 09/2022. Family history of Huntington's chorea (father deceased from this illness).  Patient is intubated and sedated, therefore history is taken from chart review/patient's mother. 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 Mother urged him to go to hospital but he was refusing, on 6/21 noted to be severely altered with decreased responsiveness, his mother called EMS and he was brought to the emergency department.  In the ED, patient was noted to be agitated, restless, trying to take out his IV lines. CT Head initially unable to be completed because of restlessness and agitation, he was given Valium 10 mg without much help, eventually intubated for airway protection/ CT Head showed large frontoparietal left-sided subacute subdural hematoma with severe cerebral edema and left-sided uncal herniation.  NSGY was consulted and patient was taken to OR for crani/evacuation of subdural hematoma [name]).  PCCM was consulted for evaluation/medical management and admission to ICU  Pertinent Medical History:   Past Medical History:  Diagnosis Date   Panic attack    Polysubstance abuse (HCC)    Cocaine, BZD   SAH (subarachnoid hemorrhage) (HCC) 09/2022   Traumatic   Significant Hospital Events: Including procedures, antibiotic start and stop dates in addition to other pertinent events   6/21 - Presented to [facility] ED for AMS. CT Head with large L frontoparietal convexity SDH up to 20mm in coronal plane, 18mm L to R shift, L uncal herniation/torsion of brainstem and  entrapment of lateral ventricles. CT C-spine without acute fracture. Taken to OR for crani/hematoma evacuation [name]). 6/22 - Repeat CT Head (postop) with evacuated SDH, improved midline shift (reduced to 6mm). 6/23 - Extubated to 3LNC. 6/24 - Agitated requiring increased sedation, CIWA started. 6/25 - Persistent agitated, severe multifactorial encephalopathy; clonidine and phenobarb started to attempt to wean from Precedex.  Interim History / Subjective:  Agitated overnight requiring increased sedation Much more somnolent today Precedex turned off, if remains calm without agitation will wean clonidine  Objective   Blood pressure 111/83, pulse 63, temperature 97.6 F (36.4 C), temperature source Oral, resp. rate 18, height 5\' 10"  (1.778 m), weight 78.9 kg, SpO2 98 %.        Intake/Output Summary (Last 24 hours) at 12/11/2022 0953 Last data filed at 12/11/2022 0600 Gross per 24 hour  Intake 1395.35 ml  Output 2100 ml  Net -704.65 ml   Filed Weights   12/09/22 0400 12/10/22 0400 12/11/22 0706  Weight: 81 kg 77.2 kg 78.9 kg   Physical Examination: General: Acutely ill-appearing man in NAD. HEENT: [place]/AT, anicteric sclera, PERRL 4mm, dry mucous membranes. Neuro: Sedated, minimally responsive but protecting airway. Does not respond to verbal/tactile/noxious stimuli. Withdraws to pain in all 4 extremities. Not following commands. +Corneal, +Cough, and +Gag  CV: RRR, no m/g/r. PULM: Breathing even and unlabored on RA. Lung fields CTAB. GI: Soft, nontender, nondistended. Normoactive bowel sounds. Extremities: No LE edema noted. Skin: Warm/dry, no rashes.  Assessment & Plan:  Left large subacute subdural hematoma Cerebral edema with brain compression, midline shift of 18 mm - s/p mannitol and 3% NS Left-sided uncal herniation S/p left hemicraniectomy for  SDH evacuation and bur hole placement 6/21 - NSGY following, appreciate recs - Further brain imaging per NSGY - Continue Keppra  500mg  BID for seizure ppx - Seizure/aspiration precautions - PT/OT/SLP when able to participate in care, CIR consult  Acute respiratory insufficiency - s/p intubation 6/21 with subsequent extubation 6/23 - Continue supplemental O2 support PRN - Wean O2 for sat > 90% - Pulmonary hygiene, IS/OOB as able  Acute encephalopathy due to subdural hematoma Polysubstance abuse - Patient's UDS is positive for cocaine and benzos - PAD protocol for sedation: Precedex for goal RASS 0 to -1 - Clonidine initiated to facilitate Precedex wean, decrease as able - Monitor for signs of withdrawal - S/p phenobarbital dose - CIWA protocol with Ativan, consider d/c scheduled - Continue Seroquel QHS, may need dose reduction  Best Practice (right click and "Reselect all SmartList Selections" daily)   Diet/type: NPO DVT prophylaxis: SCDs, no AC in the setting of SDH GI prophylaxis: PPI Central venous access:  N/A Foley:  N/A Code Status:  full code Last date of multidisciplinary goals of care discussion [Pending]  Critical care time:   The patient is critically ill with multiple organ system failure and requires high complexity decision making for assessment and support, frequent evaluation and titration of therapies, advanced monitoring, review of radiographic studies and interpretation of complex data.   Critical Care Time devoted to patient care services, exclusive of separately billable procedures, described in this note is 35 minutes.  Tim Lair, PA-C  Pulmonary & Critical Care 12/11/22 9:59 AM  Please see [email or web address] for pager details.  From 7A-7P if no response, please call [phone] After hours, please call ELink [phone]

## 2022-12-11 NOTE — Progress Notes (Signed)
Subjective: Patient on Precedex last night, weaned this morning  Objective: Vital signs in last 24 hours: Temp:  [97.6 F (36.4 C)-98.6 F (37 C)] 98.2 F (36.8 C) (06/26 1500) Pulse Rate:  [59-78] 70 (06/26 1300) Resp:  [17-27] 22 (06/26 1500) BP: (103-146)/(67-110) 115/75 (06/26 1500) SpO2:  [88 %-98 %] 98 % (06/26 1200) Weight:  [78.9 kg] 78.9 kg (06/26 0706)  Intake/Output from previous day: 06/25 0701 - 06/26 0700 In: 1579.6 [I.V.:1479.6; IV Piggyback:100] Out: 2100 [Urine:2100] Intake/Output this shift: Total I/O In: -  Out: 1150 [Urine:1150]  Eyes open, confused but says name FC x 4 Poor balance with standing Scalp incision c/d  Lab Results: Recent Labs    12/10/22 0254  WBC 7.0  HGB 13.3  HCT 37.3*  PLT 228   BMET Recent Labs    12/10/22 0254  NA 136  K 3.6  CL 102  CO2 24  GLUCOSE 103*  BUN 8  CREATININE 0.72  CALCIUM 8.7*    Studies/Results: No results found.  Assessment/Plan: 42 yo M s/p L crani for SDH - cont supportive care   ELVIS LAUFER 12/11/2022, 5:10 PM

## 2022-12-11 NOTE — Progress Notes (Signed)
eLink Physician-Brief Progress Note Patient Name: Kamari Buch Omary DOB: 1980/07/01 MRN: [id number]   Date of Service  12/11/2022  HPI/Events of Note  Patient encephalopathic in the setting of TBI and polysubstance abuse.  Nurse reported agitated delirium with patient trying to pull off equipment.  Restraints requested.  eICU Interventions  Bilateral soft wrist restraints ordered.        [name] 12/11/2022, 11:38 PM

## 2022-12-11 NOTE — Progress Notes (Addendum)
Inpatient Rehab Admissions Coordinator:    I met with pt. To discuss potential CIR admit. He is interested. I spoke with his mother over the phone and she states that she and his brothers can provide 24/7 support at d/c. Per RN, Pt. Was very sedated overnight and this AM, but precedex was stopped and pt.'s mentation quickly cleared up. In conversation with Pt., he provided appropriate answers to all my questions but lacked awareness of deficits. If he does well throughout the day off of his precedex drip, may send case to insurance tomorrow.   Megan Salon, MS, CCC-SLP Rehab Admissions Coordinator  [phone] (celll) [phone] (office)

## 2022-12-11 NOTE — Progress Notes (Signed)
Physical Therapy Treatment Patient Details Name: Joshua Bishop MRN: [id number] DOB: [birth year] Today's Date: 12/11/2022   History of Present Illness 42 yo male admitted 12/06/22 due to AMS, found to have large L subacute SDH, now s/p L crani on 6/21 for evacuation of SDH.  PMH anxiety, panic attacks, 04/24 SAH.    PT Comments    Patient progressing to hallway ambulation this session.  Still very unsafe and impulsive unaware of deficits and exhibiting behavior consistent with Rancho level V.  Patient tolerating mobility well, but has ataxic erratic pattern to stride length and width and poor safety awareness lifting one foot up while standing at EOB due to sock not feeling right.  Patient is appropriate for intensive inpatient rehab prior to d/c home with family support.   Recommendations for follow up therapy are one component of a multi-disciplinary discharge planning process, led by the attending physician.  Recommendations may be updated based on patient status, additional functional criteria and insurance authorization.  Follow Up Recommendations       Assistance Recommended at Discharge Intermittent Supervision/Assistance  Patient can return home with the following A lot of help with bathing/dressing/bathroom;A lot of help with walking and/or transfers;Help with stairs or ramp for entrance;Assistance with feeding;Assist for transportation;Assistance with cooking/housework;Direct supervision/assist for medications management   Equipment Recommendations  Other (comment) (TBA)    Recommendations for Other Services       Precautions / Restrictions Precautions Precautions: Fall Precaution Comments: Posey belt, wrist restraints, mittens CIWA     Mobility  Bed Mobility Overal bed mobility: Needs Assistance Bed Mobility: Supine to Sit, Sit to Supine     Supine to sit: Min assist Sit to supine: Min assist   General bed mobility comments: assist for lifting trunk to sit,  assist for positioning and safety to supine    Transfers Overall transfer level: Needs assistance Equipment used: 2 person hand held assist Transfers: Sit to/from Stand Sit to Stand: Min assist, +2 safety/equipment           General transfer comment: up from EOB with A for balance; lines removed for mobility    Ambulation/Gait Ambulation/Gait assistance: Mod assist, Min assist, +2 physical assistance, Max assist Gait Distance (Feet): 200 Feet Assistive device: 1 person hand held assist, 2 person hand held assist Gait Pattern/deviations: Step-through pattern, Ataxic, Scissoring, Drifts right/left, Wide base of support, Trunk flexed       General Gait Details: easily distracted by environment; assist for keeping COG over BOS and initially 2 person assist, increased assist with +1 help for ambulation mod to occasional max A when LOB due to overcorrecting or overstepping.   Stairs             Wheelchair Mobility    Modified Rankin (Stroke Patients Only)       Balance Overall balance assessment: Needs assistance   Sitting balance-Leahy Scale: Fair Sitting balance - Comments: sitting up in bed to don socks; on EOB to doff and then don sock close S for safety   Standing balance support: Single extremity supported, No upper extremity supported Standing balance-Leahy Scale: Poor Standing balance comment: A for balance standing at sink to brush teeth and for safety due to posterior bias at times, then leaning far forward risk hitting head on mirror or towel dispencer.                            Cognition Arousal/Alertness: Awake/alert Behavior During  Therapy: Restless Overall Cognitive Status: Impaired/Different from baseline Area of Impairment: Safety/judgement, Awareness, Problem solving, Memory, Rancho level               Rancho Levels of Cognitive Functioning Rancho Los Amigos Scales of Cognitive Functioning: Confused, Inappropriate  Non-Agitated   Current Attention Level: Sustained Memory: Decreased short-term memory Following Commands: Follows one step commands consistently, Follows multi-step commands inconsistently Safety/Judgement: Decreased awareness of deficits, Decreased awareness of safety   Problem Solving: Slow processing General Comments: assist throughout for safety as pt unaware of deficits, able to state location and situation, but did not recall which floor he was on after being told earlier in the session.  Impulsive with ADL tasks in bathroom drinking from sink faucet despite cues to use cup and leaning in risking hitting head on paper towel dispencer, etc. States "just messin around" when noting his gait/balance impaired.   Rancho [employer] Scales of Cognitive Functioning: Confused, Inappropriate Non-Agitated    Exercises      General Comments General comments (skin integrity, edema, etc.): VSS, RN aware replaced posey only and left wrists/hands unrestrained      Pertinent Vitals/Pain Pain Assessment Pain Assessment: No/denies pain    Home Living                          Prior Function            PT Goals (current goals can now be found in the care plan section) Progress towards PT goals: Progressing toward goals    Frequency    Min 4X/week      PT Plan Current plan remains appropriate    Co-evaluation PT/OT/SLP Co-Evaluation/Treatment: Yes Reason for Co-Treatment: Necessary to address cognition/behavior during functional activity;For patient/therapist safety PT goals addressed during session: Mobility/safety with mobility;Balance;Strengthening/ROM        AM-PAC PT "6 Clicks" Mobility   Outcome Measure  Help needed turning from your back to your side while in a flat bed without using bedrails?: A Little Help needed moving from lying on your back to sitting on the side of a flat bed without using bedrails?: A Little Help needed moving to and from a bed to a  chair (including a wheelchair)?: A Lot Help needed standing up from a chair using your arms (e.g., wheelchair or bedside chair)?: A Lot Help needed to walk in hospital room?: Total Help needed climbing 3-5 steps with a railing? : Total 6 Click Score: 12    End of Session Equipment Utilized During Treatment: Gait belt Activity Tolerance: Patient tolerated treatment well Patient left: in bed;with call bell/phone within reach;with bed alarm set;with restraints reapplied   PT Visit Diagnosis: Other abnormalities of gait and mobility (R26.89);Muscle weakness (generalized) (M62.81);Difficulty in walking, not elsewhere classified (R26.2);Other symptoms and signs involving the nervous system (R29.898)     Time: 4098-1191 PT Time Calculation (min) (ACUTE ONLY): 28 min  Charges:  $Gait Training: 8-22 mins                     Sheran Lawless, PT Acute Rehabilitation Services Office:[phone] 12/11/2022    Joshua Bishop 12/11/2022, 2:44 PM

## 2022-12-11 NOTE — Progress Notes (Signed)
Occupational Therapy Treatment Patient Details Name: Joshua Bishop MRN: [id number] DOB: 1981/03/29 Today's Date: 12/11/2022   History of present illness 42 yo male admitted 12/06/22 due to AMS, found to have large L subacute SDH, now s/p L crani on 6/21 for evacuation of SDH.  PMH anxiety, panic attacks, 04/24 SAH.   OT comments  Pt demonstrates behavior consistent with Rancho V and balance deficits. Pt remains appropriate for Patient will benefit from intensive inpatient follow up therapy, >3 hours/day. Pt completed sink level grooming with cues for cognitive balance and safety during session.    Recommendations for follow up therapy are one component of a multi-disciplinary discharge planning process, led by the attending physician.  Recommendations may be updated based on patient status, additional functional criteria and insurance authorization.    Assistance Recommended at Discharge    Patient can return home with the following  A lot of help with bathing/dressing/bathroom;A little help with walking and/or transfers;Assist for transportation   Equipment Recommendations  Other (comment)    Recommendations for Other Services Rehab consult    Precautions / Restrictions Precautions Precautions: Fall Precaution Comments: Posey belt, wrist restraints, mittens CIWA       Mobility Bed Mobility Overal bed mobility: Needs Assistance Bed Mobility: Supine to Sit, Sit to Supine     Supine to sit: Min assist Sit to supine: Min assist   General bed mobility comments: assist for lifting trunk to sit, assist for positioning and safety to supine    Transfers Overall transfer level: Needs assistance Equipment used: 2 person hand held assist Transfers: Sit to/from Stand Sit to Stand: Min assist, +2 safety/equipment           General transfer comment: up from EOB with A for balance; lines removed for mobility     Balance Overall balance assessment: Needs assistance            Standing balance-Leahy Scale: Poor                             ADL either performed or assessed with clinical judgement   ADL Overall ADL's : Needs assistance/impaired Eating/Feeding: Set up Eating/Feeding Details (indicate cue type and reason): to drink a soda Grooming: Wash/dry face;Oral care;Moderate assistance Grooming Details (indicate cue type and reason): pt needs steady assist at sink, safety for head, and cues to sequence. pt thought bandage on arm was tooth paste because it was white in color. pt also started to brush teeth with no tooth paste but had the paste in his left hand.                                    Extremity/Trunk Assessment Upper Extremity Assessment Upper Extremity Assessment: Generalized weakness   Lower Extremity Assessment Lower Extremity Assessment: Generalized weakness        Vision   Additional Comments: pt reports vision deficits and pending glasses in 3 weeks from medicaid benefits. pt states "i dont see all that well"   Perception     Praxis      Cognition Arousal/Alertness: Awake/alert Behavior During Therapy: Restless Overall Cognitive Status: Impaired/Different from baseline Area of Impairment: Safety/judgement, Awareness, Problem solving, Memory, Rancho level               Rancho Levels of Cognitive Functioning Rancho Los Amigos Scales of Cognitive Functioning: Confused, Inappropriate Non-Agitated  Current Attention Level: Sustained Memory: Decreased short-term memory Following Commands: Follows one step commands consistently, Follows multi-step commands inconsistently Safety/Judgement: Decreased awareness of deficits, Decreased awareness of safety   Problem Solving: Slow processing General Comments: pt needed cues throughtout session for safety including not striking head on objects in the bathroom. pt showed awareness to injury with visual aid of mirror pt was able to tell new staff in room  that he fell and had brain surgery showing carry over of new information from prior visits without cues. pt attempting to joke by lifting legs [place] [address] Scales of Cognitive Functioning: Confused, Inappropriate Non-Agitated      Exercises      Shoulder Instructions       General Comments vss on ra- Rn made aware only bed level posey placed on patient. no mittens or wrist restraints. RN responds that is fine    Pertinent Vitals/ Pain       Pain Assessment Pain Assessment: No/denies pain  Home Living       Type of Home: Mobile home             Bathroom Shower/Tub: Tub/shower unit;Walk-in shower   Bathroom Toilet: Standard Bathroom Accessibility: Yes How Accessible: Accessible via wheelchair;Accessible via walker        Lives With: Alone    Prior Functioning/Environment              Frequency  Min 2X/week        Progress Toward Goals  OT Goals(current goals can now be found in the care plan section)  Progress towards OT goals: Progressing toward goals  Acute Rehab OT Goals Patient Stated Goal: to get a phone OT Goal Formulation: Patient unable to participate in goal setting Time For Goal Achievement: 12/24/22 Potential to Achieve Goals: Good ADL Goals Pt Will Perform Upper Body Bathing: with set-up;sitting Pt Will Perform Lower Body Bathing: with set-up;sit to/from stand Pt Will Perform Upper Body Dressing: with set-up;sitting Pt Will Perform Lower Body Dressing: with set-up;with min assist;sit to/from stand Pt Will Transfer to Toilet: with min assist;ambulating;regular height toilet Additional ADL Goal #1: pt will complete 3 step command 50% of attempts Additional ADL Goal #2: pt will complete 5 step pathfinding task with written instructions  Plan Discharge plan remains appropriate    Co-evaluation    PT/OT/SLP Co-Evaluation/Treatment: Yes Reason for Co-Treatment: Necessary to address cognition/behavior during functional activity;For  patient/therapist safety PT goals addressed during session: Mobility/safety with mobility;Balance;Strengthening/ROM OT goals addressed during session: ADL's and self-care      AM-PAC OT "6 Clicks" Daily Activity     Outcome Measure   Help from another person eating meals?: A Little Help from another person taking care of personal grooming?: A Lot Help from another person toileting, which includes using toliet, bedpan, or urinal?: A Lot Help from another person bathing (including washing, rinsing, drying)?: A Lot Help from another person to put on and taking off regular upper body clothing?: A Lot Help from another person to put on and taking off regular lower body clothing?: A Lot 6 Click Score: 13    End of Session Equipment Utilized During Treatment: Gait belt  OT Visit Diagnosis: Unsteadiness on feet (R26.81);Muscle weakness (generalized) (M62.81);Cognitive communication deficit (R41.841) Symptoms and signs involving cognitive functions:  (traumatic bleed due to fall)   Activity Tolerance Patient tolerated treatment well   Patient Left in bed;with call bell/phone within reach;with nursing/sitter in room;with restraints reapplied   Nurse Communication Mobility status;Precautions  Time: 1105-1130 OT Time Calculation (min): 25 min  Charges: OT General Charges $OT Visit: 1 Visit OT Treatments $Self Care/Home Management : 8-22 mins   Brynn, OTR/L  Acute Rehabilitation Services Office: [phone] .   Mateo Flow 12/11/2022, 4:54 PM

## 2022-12-12 DIAGNOSIS — S065XAA Traumatic subdural hemorrhage with loss of consciousness status unknown, initial encounter: Secondary | ICD-10-CM | POA: Diagnosis not present

## 2022-12-12 LAB — GLUCOSE, CAPILLARY
Glucose-Capillary: 104 mg/dL — ABNORMAL HIGH (ref 70–99)
Glucose-Capillary: 111 mg/dL — ABNORMAL HIGH (ref 70–99)
Glucose-Capillary: 111 mg/dL — ABNORMAL HIGH (ref 70–99)
Glucose-Capillary: 114 mg/dL — ABNORMAL HIGH (ref 70–99)
Glucose-Capillary: 130 mg/dL — ABNORMAL HIGH (ref 70–99)
Glucose-Capillary: 61 mg/dL — ABNORMAL LOW (ref 70–99)
Glucose-Capillary: 65 mg/dL — ABNORMAL LOW (ref 70–99)
Glucose-Capillary: 98 mg/dL (ref 70–99)

## 2022-12-12 MED ORDER — RAMELTEON 8 MG PO TABS
8.0000 mg | ORAL_TABLET | Freq: Every day | ORAL | Status: DC
  Administered 2022-12-12 – 2022-12-17 (×6): 8 mg via ORAL
  Filled 2022-12-12 (×8): qty 1

## 2022-12-12 MED ORDER — ETOMIDATE 2 MG/ML IV SOLN
INTRAVENOUS | Status: AC
  Filled 2022-12-12: qty 20

## 2022-12-12 MED ORDER — CLONIDINE HCL 0.2 MG/24HR TD PTWK
0.2000 mg | MEDICATED_PATCH | TRANSDERMAL | Status: DC
  Administered 2022-12-12: 0.2 mg via TRANSDERMAL
  Filled 2022-12-12: qty 1

## 2022-12-12 MED ORDER — KETAMINE HCL 50 MG/5ML IJ SOSY: 20.0000 mg | PREFILLED_SYRINGE | Freq: Once | INTRAMUSCULAR | Status: DC

## 2022-12-12 MED ORDER — VALPROATE SODIUM 100 MG/ML IV SOLN
250.0000 mg | Freq: Two times a day (BID) | INTRAVENOUS | Status: DC
  Administered 2022-12-12 (×2): 250 mg via INTRAVENOUS
  Filled 2022-12-12 (×3): qty 2.5

## 2022-12-12 MED ORDER — MIDAZOLAM HCL 2 MG/2ML IJ SOLN
INTRAMUSCULAR | Status: AC
  Administered 2022-12-12: 2 mg
  Filled 2022-12-12: qty 2

## 2022-12-12 MED ORDER — SUCCINYLCHOLINE CHLORIDE 200 MG/10ML IV SOSY
PREFILLED_SYRINGE | INTRAVENOUS | Status: AC
  Filled 2022-12-12: qty 10

## 2022-12-12 MED ORDER — DEXTROSE 50 % IV SOLN
INTRAVENOUS | Status: AC
  Filled 2022-12-12: qty 50

## 2022-12-12 MED ORDER — ROCURONIUM BROMIDE 10 MG/ML (PF) SYRINGE
PREFILLED_SYRINGE | INTRAVENOUS | Status: AC
  Filled 2022-12-12: qty 10

## 2022-12-12 MED ORDER — MIDAZOLAM HCL 2 MG/2ML IJ SOLN
INTRAMUSCULAR | Status: AC
  Filled 2022-12-12: qty 2

## 2022-12-12 MED ORDER — MIDAZOLAM HCL 2 MG/2ML IJ SOLN
INTRAMUSCULAR | Status: AC
  Administered 2022-12-12: 2 mg
  Filled 2022-12-12: qty 6

## 2022-12-12 MED ORDER — KETAMINE HCL 50 MG/5ML IJ SOSY
PREFILLED_SYRINGE | INTRAMUSCULAR | Status: AC
  Administered 2022-12-12: 40 mg via INTRAVENOUS
  Filled 2022-12-12: qty 5

## 2022-12-12 MED ORDER — KETAMINE HCL 50 MG/5ML IJ SOSY: 40.0000 mg | PREFILLED_SYRINGE | Freq: Once | INTRAMUSCULAR | Status: AC

## 2022-12-12 MED ORDER — KETAMINE HCL 50 MG/5ML IJ SOSY
PREFILLED_SYRINGE | INTRAMUSCULAR | Status: AC
  Filled 2022-12-12: qty 10

## 2022-12-12 MED ORDER — FENTANYL CITRATE PF 50 MCG/ML IJ SOSY
PREFILLED_SYRINGE | INTRAMUSCULAR | Status: AC
  Filled 2022-12-12: qty 2

## 2022-12-12 MED ORDER — KETAMINE HCL 50 MG/5ML IJ SOSY
PREFILLED_SYRINGE | INTRAMUSCULAR | Status: AC
  Filled 2022-12-12: qty 5

## 2022-12-12 MED ORDER — DEXTROSE 50 % IV SOLN
12.5000 g | INTRAVENOUS | Status: AC
  Administered 2022-12-12: 12.5 g via INTRAVENOUS

## 2022-12-12 MED ORDER — LORAZEPAM 2 MG/ML IJ SOLN
2.0000 mg | Freq: Every day | INTRAMUSCULAR | Status: DC
  Administered 2022-12-12: 2 mg via INTRAVENOUS
  Filled 2022-12-12: qty 1

## 2022-12-12 MED ORDER — LORAZEPAM 2 MG/ML IJ SOLN
INTRAMUSCULAR | Status: AC
  Administered 2022-12-12: 2 mg via INTRAVENOUS
  Filled 2022-12-12: qty 1

## 2022-12-12 MED ORDER — DEXTROSE 10 % IV SOLN: INTRAVENOUS | Status: DC

## 2022-12-12 MED ORDER — KETAMINE HCL-SODIUM CHLORIDE 1000-0.9 MG/100ML-% IV SOLN
0.5000 mg/kg/h | INTRAVENOUS | Status: DC
  Administered 2022-12-12 – 2022-12-13 (×2): 0.5 mg/kg/h via INTRAVENOUS
  Filled 2022-12-12: qty 100

## 2022-12-12 NOTE — Progress Notes (Signed)
eLink Physician-Brief Progress Note Patient Name: Joshua Bishop DOB: 1980/11/17 MRN: [id number]   Date of Service  12/12/2022  HPI/Events of Note  Nursing staff reports patient being agitated despite receiving every 4 hours lorazepam.  Patient is actually requesting a beer.  eICU Interventions  Patient's chart reviewed.  Camera exam done.  He is currently resting comfortably on dexmedetomidine which had been started by nursing staff.  Will continue to use dexmedetomidine to keep him calm.  No indication for abruption of the at this time.  Continue to monitor. Discussed with bedside RN.     Intervention Category Minor Interventions: Agitation / anxiety - evaluation and management  [name] 12/12/2022, 1:07 AM

## 2022-12-12 NOTE — TOC Progression Note (Signed)
Transition of Care [facility]) - Progression Note    Patient Details  Name: Jasraj Lappe Rosa MRN: [id number] Date of Birth: 04/25/1981  [facility]) CM/SW Contact  Mearl Latin, LCSW Phone Number: 12/12/2022, 9:58 AM  Clinical Narrative:    CSW continuing to follow for appropriateness of substance use consult. Resources placed on AVS for patient to follow up after discharge. CIR assessing.      Barriers to Discharge: Continued Medical Work up  Expected Discharge Plan and Services In-house Referral: Clinical Social Work     Living arrangements for the past 2 months: Single Family Home                                       Social Determinants of Health (SDOH) Interventions SDOH Screenings   Tobacco Use: High Risk (12/11/2022)    Readmission Risk Interventions     No data to display

## 2022-12-12 NOTE — Discharge Instructions (Signed)
In a time of Crisis: [facility]  Mobile Crisis Management provides immediate crisis response, 24/7.  Call [phone]  [facility] for MH/DD/SA [facility] is available 24 hours a day, 7 days a week. Customer Service Specialists will assist you to find a crisis provider that is well-matched with your needs. Your local number is: [phone]  [facility] (BHUC) IOP, individual counseling, medication management [phone] [place], [place] [address] [phone] Call for intake hours; Medicaid and Uninsured    Substance Use Outpatient Providers  [facility] ([facility]) Group and individual counseling. [address]  [place], [place] [address] [phone] [place]: [phone]  [place]: [phone] Medicaid and uninsured.   [facility] Offers IOP groups multiple times per week. [address] Sherian Maroon [place], [place] [address] [phone] Takes Medicaid and other insurances.   [facility]  Chemical Dependency Intensive Outpatient Program (IOP) [address] [place], [place] [address] [phone] Takes Nurse, learning disability and [place].   [facility]  IOP and Partial Hospitalization Program  [address]  [place], [place] [address] [phone] Private Insurance, [place] only for partial hospitalization  [facility] [address], [place], [place] [address] [phone] Monday-Friday. Short and Long term options.  [facility] (BHUC) IOP, individual counseling, medication management [address] [place], [place] [address] [phone] Medicaid and [facility]  [facility] [address]  [place], [place] [address] [phone] Private Insurance and Self Pay   [facility] [address]  [place], [place] [address] [phone] Private Insurance, [place], and Self Pay   [facility]  [address] [place], [place] [address] [facility]  [address]  [place], [place] [address] [phone]  [facility]  [address] [place], [place] [address] [phone]      Residential Treatment Programs  [facility] [address] [place], [place] [address] [phone] or [phone] Detox and Residential Rehab 21 days (Medicaid, private insurance, and self pay. If Medicare, will look into funding). No methadone. Call for pre-screen.   [facility]) [address]  [place], [place] [address] [phone] Detox 3-7 days (self Pay and Medicaid Limited availability). Transitional Program for females needs 60 days clean first.  Rehab Only for Males (Medicare, Medicaid, and Self Pay)-No methadone.  [facility] [address] [place], [place] [address] [phone] or [phone] Private Insurance only  [facility] PHONE: [phone] FAX: [phone] Residential program for women 21 and over for up to a year through a Christian 12-step recovery model. Self-pay.    [facility] [address] [place], [place] [address] Phone:  [phone] Must be detoxed 72 hours prior to admission; 28 day program.  Self-pay.  [facility] [address]  [place], [place] [phone] [employer], Medicare, [place] (not straight [place]). They offer assistance with transportation.   [facility] [address] [place],  [place], [place] [address] [phone] Christian Based Program. Men only. No insurance  [employer] is a substance use disorder treatment program for women, including those who are pregnant, parenting, and/or whose lives have been touched by abuse and violence. (800)  [phone]  [facility] [address]  [place], [place] [address] Women's: [phone] Men's: [phone] No Medicaid.   [facility] [place] Locations across the [place] (mainly [place]) willing to help with transportation.  [phone] [employer]. [facility]  [address]  [place], [place] [address] [phone] Treatment Only, must make assessment appointment, and must be sober for assessment appointment. Self pay, [facility], must be [facility] resident. No methadone.   [facility]  [address] [place], [place] [address] [phone] No pending legal charges, Long-term work program. No methadone. Call for assessment.  [facility]  [address], [place], [place] [address] [phone] or [phone] Commercial Insurance Only  [facility] - [phone] Private Insurance (no [place]). Males/Females, call to make referrals, multiple facilities.   [facility]: [facility] [address] [place], [place] [address] [phone]  [facility]-no methadone: [facility] [address] [place], [place] [address] [phone] [facility] [phone] [place], [place] For women, houses 8 residents for sober living. No Medicaid.         Syringe Services Program: Due to COVID-19, syringe services programs are likely operating under different hours with limited or no fixed site hours. Some programs may not be operating at all. Please contact the program directly using the phone numbers provided below to see if they are still operating under COVID-19. [facility] Solution to the Opioid Problem (GCSTOP) Fixed; mobile; peer-based;Bobbye Riggs) [phone] [email or web address] Fixed site exchange at [facility], [address] [place], [place] [address] on Wednesdays (2:00 - 5:00 pm) and Thursdays (4:00 - 8:00 pm). Pop-up mobile exchange locations: [employer] and [employer], [address] [place], [place] [address] on Tuesdays (11:00 am - 1:00 pm) and Fridays (11:00 am - 1:00 pm) -[facility] - [address], [place], [place] [address] on Tuesdays (2:00 - 4:00 pm) and Fridays (2:00 - 4:00 pm) -[facility] copy - also serves Radio broadcast assistant and [employer]  [employer];Fixed; mobile; peer-based; Lendon Ka [phone] [email or web address] [address], [place], [place] [address] Delivery and outreach available in [place] and [place], please call for more information. Monday, Tuesday: 1:00 -7:00 pm, Thursday: 4:00 pm - 8:00 pm, Friday: 1:00 pm - 8:00 pm)  Medication-Assisted Treatment (MAT):  -[facility]- services [address] and surrounding areas including [place], [place], [place], [place], [address], [place], [place], [place], [place], and [place], [place]. Options include Methadone, buprenorphine or Suboxone. [address], [place], [place] [address] Phone: [phone] Mon - Fri: 5:30am - 2:00pm Sat: 5:30am -7:30am Sun: Closed Holidays: 6:00am - 8:00am  -[facility]- We use FDA-approved medications, like methadone/suboxone/sublocade, and vivitrol. These medications are then combined with customized care plans that include individual or group counseling, toxicology, and medical care directed by on-site physicians. Accepts most insurance plans, Medicaid, and private pay.  [address] [place], [place] [address] Phone: [phone] Monday-Friday 5:00 AM - 10:00 AM Saturday 6:00 AM - 8:30 AM Sunday 6:00 AM - 7:00 AM  -[facility]- [facility] is a treatment & recovery focused program. In addition to receiving methadone medication, our clients participate in individual and group counseling as  well as random drug testing. If accepted into the [facility] Opioid Program, you will be provided several intake appointments and a physical exam [address] [place], [place] [address] Office: [phone]  Fax: [phone]  -  [facility]- We put our community members at the center of everything we do, for remote treatment services as well as in-person, from alcohol withdrawal to opioid use and more.  [address] [place] [address], [place], [place] [address] [phone] Monday-Wednesday: 9:00am - 5:00pm Thursday: 9:00am - 6:00pm Friday: 9:00am - 5:00pm Saturday: 9:00am - 1:00pm Sunday: Closed  -[facility] [place]) [address] [place], [place] [address] [phone]  [place] [phone] [address] [place], [place] [address]  M-W    5:00am-12:00pm Thu     5:00am-10:00am Fri       5:00am-12:00pm Sat      5:00am-8:00am Sun     Closed  $12/daily for Methadone Treatment.

## 2022-12-12 NOTE — Progress Notes (Signed)
Hypoglycemic in the 60's.  Sedated on ketamine, Precedex since off.  Not able to safely eat/drink given sedation currently.     P:  - treated per unit protocol - start D10 at 20 ml/hr, will need to titrate as needed - continue to closely monitor CBGs - SSI held (not been receiving)      Posey Boyer, MSN, NP, AG-ACNP-BC [facility] 12/12/2022, 3:58 PM  See Amion for pager If no response to pager , please call [phone] until 7pm After 7:00 pm call Elink  [phone]

## 2022-12-12 NOTE — Progress Notes (Signed)
Cross covering ICU physician.   Called to bedside for agitation and combativeness. Pt given escalated doses of sedatives and still req 8-10 people in ICU to hold him down. Concern for need for intubation after they have been given prompted me to bedside.   However upon arrival pt was still lifting himself and all staff members off the bed. Decision made to give dose of ketamine 40mg  given with successful sedation. Still protecting airway. Will place end tidal cannula on him and closely monitor. Will have items necessary for intubation at bedside.   Additional 20mg  ketamine to give if rass +3 to +4 until staff can get here.   Bvm, hard suction, vent and intubation meds at bedside should he require this. Will likely need repeat cth after all the thrashing. However, concern should we intubate the ability to keep him sedated + the agitation/delirium making extubation potentially challening as well.   Will discuss with daytime team who has been on case since admit.

## 2022-12-12 NOTE — Progress Notes (Signed)
Pt around 0600 am started to become increasingly agitated and required multiple staff members to control. Pt was given multiple medications to sedate. MD bedside and camera bedside. MD aware of circumstances and potential intubation discussed.

## 2022-12-12 NOTE — Progress Notes (Addendum)
NAME:  Joshua Bishop, MRN:  [id number], DOB:  1981-03-27, LOS: 6 ADMISSION DATE: 12/06/2022, CONSULTATION DATE: 12/06/2022 REFERRING MD: Lynelle Doctor - EDP, CHIEF COMPLAINT: Altered mental status  History of Present Illness:  42 year old male who presented to [facility] ED 6/21 for AMS x 2-3 days. PMHx significant for anxiety/panic attacks, recent admission for traumatic [facility] 09/2022. Family history of Huntington's chorea (father deceased from this illness).  Patient is intubated and sedated, therefore history is taken from chart review/patient's mother. 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 Mother urged him to go to hospital but he was refusing, on 6/21 noted to be severely altered with decreased responsiveness, his mother called EMS and he was brought to the emergency department.  In the ED, patient was noted to be agitated, restless, trying to take out his IV lines. CT Head initially unable to be completed because of restlessness and agitation, he was given Valium 10 mg without much help, eventually intubated for airway protection/ CT Head showed large frontoparietal left-sided subacute subdural hematoma with severe cerebral edema and left-sided uncal herniation.  NSGY was consulted and patient was taken to OR for crani/evacuation of subdural hematoma [name]).  PCCM was consulted for evaluation/medical management and admission to ICU  Pertinent Medical History:   Past Medical History:  Diagnosis Date   Panic attack    Polysubstance abuse (HCC)    Cocaine, BZD   SAH (subarachnoid hemorrhage) (HCC) 09/2022   Traumatic   Significant Hospital Events: Including procedures, antibiotic start and stop dates in addition to other pertinent events   6/21 - Presented to [facility] ED for AMS. CT Head with large L frontoparietal convexity SDH up to 20mm in coronal plane, 18mm L to R shift, L uncal herniation/torsion of brainstem and  entrapment of lateral ventricles. CT C-spine without acute fracture. Taken to OR for crani/hematoma evacuation [name]). 6/22 - Repeat CT Head (postop) with evacuated SDH, improved midline shift (reduced to 6mm). 6/23 - Extubated to 3LNC. 6/24 - Agitated requiring increased sedation, CIWA started. 6/25 - Persistent agitated, severe multifactorial encephalopathy; clonidine and phenobarb started to attempt to wean from Precedex.  Interim History / Subjective:  Again had major agitation/panic attack this am. Now sedated with ketamine.  Objective   Blood pressure 100/64, pulse 80, temperature 98.9 F (37.2 C), temperature source Axillary, resp. rate 19, height 5\' 10"  (1.778 m), weight 78.9 kg, SpO2 100 %.        Intake/Output Summary (Last 24 hours) at 12/12/2022 0721 Last data filed at 12/12/2022 0600 Gross per 24 hour  Intake 641.02 ml  Output 2900 ml  Net -2258.98 ml    Filed Weights   12/09/22 0400 12/10/22 0400 12/11/22 0706  Weight: 81 kg 77.2 kg 78.9 kg   Physical Examination: Sedated EtCO2 mid 30s Ext warm Crani site looks okay Lungs clear Heart sounds regular  Assessment & Plan:  Left large subacute subdural hematoma Cerebral edema with brain compression, midline shift of 18 mm - s/p mannitol and 3% NS Left-sided uncal herniation S/p left hemicraniectomy for SDH evacuation and bur hole placement 6/21 - NSGY following, appreciate recs - Further brain imaging per NSGY - Continue Keppra 500mg  BID for seizure ppx - Seizure/aspiration precautions - PT/OT/SLP when able to participate in care, CIR consult  Acute respiratory insufficiency - s/p intubation 6/21 with subsequent extubation 6/23 - Continue supplemental O2 support PRN - Wean O2 for sat > 90% - Pulmonary  hygiene, IS/OOB as able  Acute encephalopathy due to subdural hematoma- and likely w/d, ICU delirium Polysubstance abuse - Patient's UDS is positive for cocaine and benzos Hx panic attacks - Reorient,  encourage day/night cycles, exercise during day if able - Restraints to prevent self-harm - Continue scheduled phenobarb, seroquel - Continue precedex - Add ketamine gtt, depakote, qHS rozerem/ativan - EtCO2 monitoring; unfortunately high risk for intubation  Best Practice (right click and "Reselect all SmartList Selections" daily)   Diet/type: place cortrak DVT prophylaxis: SCDs, no AC in the setting of SDH; question when can resume GI prophylaxis: PPI Central venous access:  N/A Foley:  N/A Code Status:  full code Last date of multidisciplinary goals of care discussion [Pending]  33 min cc time  Lorin Glass, MD [facility] 12/12/22 7:21 AM  Please see [email or web address] for pager details.  From 7A-7P if no response, please call [phone] After hours, please call ELink [phone]

## 2022-12-12 NOTE — Progress Notes (Signed)
Inpatient Rehabilitation Admissions Coordinator   We continue to follow his progress. Not at a level to pursue CIR admit at this time.  Ottie Glazier, RN, MSN Rehab Admissions Coordinator [phone] 12/12/2022 12:10 PM

## 2022-12-12 NOTE — Progress Notes (Signed)
PT Cancellation Note  Patient Details Name: Keenan Trefry Woelfel MRN: [id number] DOB: 04-21-1981   Cancelled Treatment:    Reason Eval/Treat Not Completed: Medical issues which prohibited therapy; Per RN sedated from meds earlier due to agitation.  Will attempt another day.   Elray Mcgregor 12/12/2022, 4:37 PM Sheran Lawless, PT Acute Rehabilitation Services Office:[phone] 12/12/2022

## 2022-12-12 NOTE — Progress Notes (Signed)
eLink Physician-Brief Progress Note Patient Name: Joshua Bishop DOB: 1980/07/29 MRN: [id number]   Date of Service  12/12/2022  HPI/Events of Note  Patient extremely agitated.  Required at least 10 nurses to hold on.  He has already received 4 mg of intramuscular lorazepam without effect.  eICU Interventions  IV lorazepam 2 mg x 2 given.  Subsequently, IV midazolam 5 mg, then 2 mg and then another 5 mg given with patient still agitated requiring multiple nurses to hold him down.  Dexmedetomidine increased to 1.5 with patient still agitated and delirious.  Paged to ground team for possible intubation since the amount of sedation he is requiring might end up with him unable to protect his airway.     Intervention Category Major Interventions: Delirium, psychosis, severe agitation - evaluation and management  [name] 12/12/2022, 6:27 AM

## 2022-12-13 ENCOUNTER — Inpatient Hospital Stay (HOSPITAL_COMMUNITY): Payer: Medicaid Other

## 2022-12-13 DIAGNOSIS — S065XAA Traumatic subdural hemorrhage with loss of consciousness status unknown, initial encounter: Secondary | ICD-10-CM | POA: Diagnosis not present

## 2022-12-13 LAB — GLUCOSE, CAPILLARY
Glucose-Capillary: 101 mg/dL — ABNORMAL HIGH (ref 70–99)
Glucose-Capillary: 111 mg/dL — ABNORMAL HIGH (ref 70–99)
Glucose-Capillary: 129 mg/dL — ABNORMAL HIGH (ref 70–99)
Glucose-Capillary: 134 mg/dL — ABNORMAL HIGH (ref 70–99)
Glucose-Capillary: 84 mg/dL (ref 70–99)
Glucose-Capillary: 96 mg/dL (ref 70–99)

## 2022-12-13 MED ORDER — QUETIAPINE FUMARATE 100 MG PO TABS
100.0000 mg | ORAL_TABLET | Freq: Every morning | ORAL | Status: DC
  Administered 2022-12-13 – 2022-12-18 (×6): 100 mg via ORAL
  Filled 2022-12-13 (×6): qty 1

## 2022-12-13 MED ORDER — QUETIAPINE FUMARATE 200 MG PO TABS
200.0000 mg | ORAL_TABLET | Freq: Every day | ORAL | Status: DC
  Administered 2022-12-13 – 2022-12-17 (×5): 200 mg via ORAL
  Filled 2022-12-13 (×5): qty 1

## 2022-12-13 MED ORDER — PHENOBARBITAL SODIUM 130 MG/ML IJ SOLN
97.5000 mg | Freq: Three times a day (TID) | INTRAMUSCULAR | Status: DC
  Administered 2022-12-14 – 2022-12-16 (×5): 97.5 mg via INTRAVENOUS
  Filled 2022-12-13 (×4): qty 1
  Filled 2022-12-13: qty 0.75

## 2022-12-13 MED ORDER — PANTOPRAZOLE SODIUM 40 MG PO TBEC: 40.0000 mg | DELAYED_RELEASE_TABLET | Freq: Every day | ORAL | Status: DC

## 2022-12-13 MED ORDER — PHENOBARBITAL SODIUM 65 MG/ML IJ SOLN
65.0000 mg | Freq: Three times a day (TID) | INTRAMUSCULAR | Status: DC
  Administered 2022-12-16 (×2): 65 mg via INTRAVENOUS
  Filled 2022-12-13 (×2): qty 1

## 2022-12-13 MED ORDER — PHENOBARBITAL SODIUM 65 MG/ML IJ SOLN: 32.5000 mg | Freq: Three times a day (TID) | INTRAMUSCULAR | Status: DC

## 2022-12-13 MED ORDER — HALOPERIDOL LACTATE 5 MG/ML IJ SOLN
5.0000 mg | Freq: Every day | INTRAMUSCULAR | Status: DC
  Administered 2022-12-13 – 2022-12-14 (×2): 5 mg via INTRAVENOUS
  Filled 2022-12-13 (×2): qty 1

## 2022-12-13 NOTE — Procedures (Signed)
Cortrak  Person Inserting Tube:  Mahala Menghini, RD Tube Type:  Cortrak - 43 inches Tube Size:  10 Tube Location:  Left nare Secured by: Bridle Technique Used to Measure Tube Placement:  Marking at nare/corner of mouth Cortrak Secured At:  71 cm   Cortrak Tube Team Note:  Consult received to place a Cortrak feeding tube.   X-ray is required, abdominal x-ray has been ordered by the Cortrak team. Please confirm tube placement before using the Cortrak tube.   If the tube becomes dislodged please keep the tube and contact the Cortrak team at [email or web address] for replacement.  If after hours and replacement cannot be delayed, place a NG tube and confirm placement with an abdominal x-ray.    Mertie Clause, MS, RD, LDN Inpatient Clinical Dietitian Please see AMiON for contact information.

## 2022-12-13 NOTE — Progress Notes (Signed)
NAME:  Joshua Bishop, MRN:  [id number], DOB:  04-02-1981, LOS: 7 ADMISSION DATE: 12/06/2022, CONSULTATION DATE: 12/06/2022 REFERRING MD: Lynelle Doctor - EDP, CHIEF COMPLAINT: Altered mental status  History of Present Illness:  42 year old male who presented to [facility] ED 6/21 for AMS x 2-3 days. PMHx significant for anxiety/panic attacks, recent admission for traumatic [facility] 09/2022. Family history of Huntington's chorea (father deceased from this illness).  Patient is intubated and sedated, therefore history is taken from chart review/patient's mother. 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 Mother urged him to go to hospital but he was refusing, on 6/21 noted to be severely altered with decreased responsiveness, his mother called EMS and he was brought to the emergency department.  In the ED, patient was noted to be agitated, restless, trying to take out his IV lines. CT Head initially unable to be completed because of restlessness and agitation, he was given Valium 10 mg without much help, eventually intubated for airway protection/ CT Head showed large frontoparietal left-sided subacute subdural hematoma with severe cerebral edema and left-sided uncal herniation.  NSGY was consulted and patient was taken to OR for crani/evacuation of subdural hematoma [name]).  PCCM was consulted for evaluation/medical management and admission to ICU  Pertinent Medical History:   Past Medical History:  Diagnosis Date   Panic attack    Polysubstance abuse (HCC)    Cocaine, BZD   SAH (subarachnoid hemorrhage) (HCC) 09/2022   Traumatic   Significant Hospital Events: Including procedures, antibiotic start and stop dates in addition to other pertinent events   6/21 - Presented to [facility] ED for AMS. CT Head with large L frontoparietal convexity SDH up to 20mm in coronal plane, 18mm L to R shift, L uncal herniation/torsion of brainstem and  entrapment of lateral ventricles. CT C-spine without acute fracture. Taken to OR for crani/hematoma evacuation [name]). 6/22 - Repeat CT Head (postop) with evacuated SDH, improved midline shift (reduced to 6mm). 6/23 - Extubated to 3LNC. 6/24 - Agitated requiring increased sedation, CIWA started. 6/25 - Persistent agitated, severe multifactorial encephalopathy; clonidine and phenobarb started to attempt to wean from Precedex. 6/27 extreme agitation despite above measures. Started on ketamine  Interim History / Subjective:  Started on Ketamine yesterday Oversedated this AM. It appears his main agitation episodes occur overnight per nursing  Objective   Blood pressure 105/79, pulse (!) 59, temperature (!) 96.3 F (35.7 C), temperature source Axillary, resp. rate 17, height 5\' 10"  (1.778 m), weight 74.8 kg, SpO2 100 %.        Intake/Output Summary (Last 24 hours) at 12/13/2022 0802 Last data filed at 12/13/2022 0600 Gross per 24 hour  Intake 937.04 ml  Output 1175 ml  Net -237.96 ml    Filed Weights   12/11/22 0706 12/12/22 0700 12/13/22 0500  Weight: 78.9 kg 75.2 kg 74.8 kg   Physical Examination: General: Adult male, sedated, in NAD. Neuro: Sedated, not responsive. HEENT: [place]/AT. Sclerae anicteric. MMM. Cardiovascular: RRR, no M/R/G.  Lungs: Respirations even and unlabored.  CTA bilaterally, No W/R/R. Abdomen: BS x 4, soft, NT/ND.  Musculoskeletal: No gross deformities, no edema.  Skin: Intact, warm, no rashes.   Assessment & Plan:   Left large subacute subdural hematoma Cerebral edema with brain compression, midline shift of 18 mm - s/p mannitol and 3% NS Left-sided uncal herniation S/p left hemicraniectomy for SDH evacuation and bur hole placement 6/21 - NSGY following, nothing acute needed  thus far - Further brain imaging per NSGY - Stop Keppra today as it has been 7 days of therapy for seizure prophylaxis - Seizure/aspiration precautions - PT/OT/SLP when able to  participate in care, CIR consult  Acute respiratory insufficiency - s/p intubation 6/21 with subsequent extubation 6/23 - Continue supplemental O2 support PRN - Wean O2 for sat > 90% - Pulmonary hygiene, IS/OOB as able - Conitnue ETCO2 for now, hopefully d/c today along with Ketamine  Acute encephalopathy due to subdural hematoma + ICU delirium - also likely a component of undiagnosed psychiatric illness given his hx of ED presentations and per report self medicating as outpatient Polysubstance abuse - Patient's UDS is positive for cocaine and benzos Hx panic attacks - Reorient, encourage day/night cycles - D/c Ketamine today - Continue Precedex - Continue phenobarb, seroquel (change from 100 BID to 100qAM and 200qhs), Clonidine patch, Ramelteon - D/c Valproic Acid - Continue PRN Ativan - Add at bedtime Haldol - Restraints to prevent self-harm - Needs psychiatric evaluation once mental status is improved  Best Practice (right click and "Reselect all SmartList Selections" daily)   Diet/type: place cortrak DVT prophylaxis: SCDs, no AC in the setting of SDH; question when can resume GI prophylaxis: PPI Central venous access:  N/A Foley:  N/A Code Status:  full code Last date of multidisciplinary goals of care discussion [Pending]   CC time: 30 min.   Rutherford Guys, PA - [facility] [facility] For pager details, please see AMION or use Epic chat  After 1900, please call [facility] for cross coverage needs 12/13/2022, 8:21 AM

## 2022-12-13 NOTE — Progress Notes (Signed)
Physical Therapy Treatment Patient Details Name: Joshua Bishop MRN: [id number] DOB: [birth year] Today's Date: 12/13/2022   History of Present Illness 42 yo male admitted 12/06/22 due to AMS, found to have large L subacute SDH, now s/p L crani on 6/21 for evacuation of SDH.  PMH anxiety, panic attacks, 04/24 SAH.    PT Comments    Patient progressing back to hallway ambulation though remains quite impulsive and needs +2 for safety as very high fall risk with poor safety/deficit awareness and impulsivity.  Patient today still at [facility] level V as last session, though demonstrating level IV behaviors over past day requiring heavy sedating meds.  PT will continue to follow.   Recommendations for follow up therapy are one component of a multi-disciplinary discharge planning process, led by the attending physician.  Recommendations may be updated based on patient status, additional functional criteria and insurance authorization.  Follow Up Recommendations       Assistance Recommended at Discharge Intermittent Supervision/Assistance  Patient can return home with the following A lot of help with bathing/dressing/bathroom;A lot of help with walking and/or transfers;Help with stairs or ramp for entrance;Assistance with feeding;Assist for transportation;Assistance with cooking/housework;Direct supervision/assist for medications management   Equipment Recommendations   (TBA)    Recommendations for Other Services       Precautions / Restrictions Precautions Precautions: Fall Precaution Comments: Posey belt, wrist restraints, mittens CIWA Restrictions Weight Bearing Restrictions: No     Mobility  Bed Mobility Overal bed mobility: Needs Assistance Bed Mobility: Supine to Sit, Sit to Supine     Supine to sit: Mod assist, +2 for safety/equipment Sit to supine: Min assist, +2 for safety/equipment   General bed mobility comments: slow to rise with support for balance and scooting hips     Transfers Overall transfer level: Needs assistance Equipment used: Rolling walker (2 wheels) Transfers: Sit to/from Stand Sit to Stand: Min assist, +2 physical assistance           General transfer comment: up with assistance for balance and lines    Ambulation/Gait Ambulation/Gait assistance: Mod assist, +2 physical assistance Gait Distance (Feet): 150 Feet Assistive device: Rolling walker (2 wheels) Gait Pattern/deviations: Step-through pattern, Ataxic, Scissoring, Drifts right/left, Wide base of support, Trunk flexed       General Gait Details: assist for walker management, pt moving too quickly at times for safety due to IV, assist for obstacles with RW, etc   Stairs             Wheelchair Mobility    Modified Rankin (Stroke Patients Only)       Balance Overall balance assessment: Needs assistance   Sitting balance-Leahy Scale: Fair Sitting balance - Comments: on EOB with minguard for safety   Standing balance support: Bilateral upper extremity supported Standing balance-Leahy Scale: Poor Standing balance comment: needs UE support for safety and A for balance                            Cognition Arousal/Alertness: Awake/alert Behavior During Therapy: Restless Overall Cognitive Status: Impaired/Different from baseline Area of Impairment: Safety/judgement, Awareness, Problem solving, Memory, Rancho level               Rancho Levels of Cognitive Functioning Rancho Los Amigos Scales of Cognitive Functioning: Confused, Inappropriate Non-Agitated   Current Attention Level: Sustained Memory: Decreased short-term memory Following Commands: Follows one step commands consistently, Follows multi-step commands inconsistently Safety/Judgement: Decreased awareness of deficits, Decreased  awareness of safety Awareness: Intellectual Problem Solving: Slow processing, Requires verbal cues General Comments: lethargic and confused but participative  today; has had periods of severe agitation more consistent with Rancho level IV   [place] [address] Scales of Cognitive Functioning: Confused, Inappropriate Non-Agitated    Exercises      General Comments General comments (skin integrity, edema, etc.): VSS on RA, replaced restraints      Pertinent Vitals/Pain Pain Assessment Pain Assessment: Faces Pain Score: 0-No pain    Home Living                          Prior Function            PT Goals (current goals can now be found in the care plan section) Progress towards PT goals: Progressing toward goals    Frequency    Min 4X/week      PT Plan Current plan remains appropriate    Co-evaluation              AM-PAC PT "6 Clicks" Mobility   Outcome Measure  Help needed turning from your back to your side while in a flat bed without using bedrails?: A Little Help needed moving from lying on your back to sitting on the side of a flat bed without using bedrails?: A Lot Help needed moving to and from a bed to a chair (including a wheelchair)?: A Lot Help needed standing up from a chair using your arms (e.g., wheelchair or bedside chair)?: Total Help needed to walk in hospital room?: Total Help needed climbing 3-5 steps with a railing? : Total 6 Click Score: 10    End of Session Equipment Utilized During Treatment: Gait belt Activity Tolerance: Patient tolerated treatment well Patient left: in bed;with call bell/phone within reach;with restraints reapplied;with bed alarm set   PT Visit Diagnosis: Other abnormalities of gait and mobility (R26.89);Muscle weakness (generalized) (M62.81);Difficulty in walking, not elsewhere classified (R26.2);Other symptoms and signs involving the nervous system (R29.898)     Time: 1232-1300 PT Time Calculation (min) (ACUTE ONLY): 28 min  Charges:  $Gait Training: 8-22 mins $Therapeutic Activity: 8-22 mins                     Sheran Lawless, PT Acute Rehabilitation  Services [phone] 12/13/2022    Joshua Bishop 12/13/2022, 6:16 PM

## 2022-12-13 NOTE — Progress Notes (Signed)
Subjective: NAEs o/n  Objective: Vital signs in last 24 hours: Temp:  [96.3 F (35.7 C)-97.6 F (36.4 C)] 97.6 F (36.4 C) (06/28 1200) Pulse Rate:  [54-70] 62 (06/28 1200) Resp:  [12-18] 18 (06/28 1200) BP: (97-134)/(65-100) 116/85 (06/28 1200) SpO2:  [80 %-100 %] 80 % (06/28 1200) Weight:  [74.8 kg] 74.8 kg (06/28 0500)  Intake/Output from previous day: 06/27 0701 - 06/28 0700 In: 937 [I.V.:631.8; IV Piggyback:305.3] Out: 1175 [Urine:1175] Intake/Output this shift: Total I/O In: 297.4 [I.V.:197.4; IV Piggyback:100] Out: 300 [Urine:300] On Precedex gtt Awake but slightly drowsy, eyes open spontaneously Oriented to person FC x 4, no obvious drift Incision stapled, c/d/i  Lab Results: No results for input(s): "WBC", "HGB", "HCT", "PLT" in the last 72 hours. BMET No results for input(s): "NA", "K", "CL", "CO2", "GLUCOSE", "BUN", "CREATININE", "CALCIUM" in the last 72 hours.  Studies/Results: DG Abd Portable 1V  Result Date: 12/13/2022 CLINICAL DATA:  Feeding tube placement EXAM: PORTABLE ABDOMEN - 1 VIEW limited for tube placement COMPARISON:  X-ray 12/06/2022 FINDINGS: Dobbhoff tube with tip extending to the right of the spine extending inferiorly, likely along the proximal duodenum. Elsewhere in the visualized upper abdomen there is some air along nondilated loops of bowel IMPRESSION: Dobbhoff tube with tip overlying the proximal duodenum. Electronically Signed   By: Karen Kays M.D.   On: 12/13/2022 10:27    Assessment/Plan: 42 yo M s/p L crani for large subacute SDH.  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 - continue supportive care   LOS: 7 days     DONLD LUEBBE 12/13/2022, 12:43 PM

## 2022-12-14 DIAGNOSIS — S065XAA Traumatic subdural hemorrhage with loss of consciousness status unknown, initial encounter: Secondary | ICD-10-CM | POA: Diagnosis not present

## 2022-12-14 LAB — CBC
HCT: 38.7 % — ABNORMAL LOW (ref 39.0–52.0)
Hemoglobin: 13.6 g/dL (ref 13.0–17.0)
MCH: 30.8 pg (ref 26.0–34.0)
MCHC: 35.1 g/dL (ref 30.0–36.0)
MCV: 87.6 fL (ref 80.0–100.0)
Platelets: 279 10*3/uL (ref 150–400)
RBC: 4.42 MIL/uL (ref 4.22–5.81)
RDW: 12.2 % (ref 11.5–15.5)
WBC: 7 10*3/uL (ref 4.0–10.5)
nRBC: 0 % (ref 0.0–0.2)

## 2022-12-14 LAB — GLUCOSE, CAPILLARY
Glucose-Capillary: 113 mg/dL — ABNORMAL HIGH (ref 70–99)
Glucose-Capillary: 123 mg/dL — ABNORMAL HIGH (ref 70–99)
Glucose-Capillary: 137 mg/dL — ABNORMAL HIGH (ref 70–99)
Glucose-Capillary: 142 mg/dL — ABNORMAL HIGH (ref 70–99)
Glucose-Capillary: 72 mg/dL (ref 70–99)
Glucose-Capillary: 89 mg/dL (ref 70–99)

## 2022-12-14 LAB — BASIC METABOLIC PANEL
Anion gap: 9 (ref 5–15)
BUN: 11 mg/dL (ref 6–20)
CO2: 25 mmol/L (ref 22–32)
Calcium: 9 mg/dL (ref 8.9–10.3)
Chloride: 103 mmol/L (ref 98–111)
Creatinine, Ser: 0.8 mg/dL (ref 0.61–1.24)
GFR, Estimated: >60 mL/min (ref >60)
Glucose, Bld: 101 mg/dL — ABNORMAL HIGH (ref 70–99)
Potassium: 3.6 mmol/L (ref 3.5–5.1)
Sodium: 137 mmol/L (ref 135–145)

## 2022-12-14 LAB — MAGNESIUM: Magnesium: 2.1 mg/dL (ref 1.7–2.4)

## 2022-12-14 LAB — PHOSPHORUS: Phosphorus: 3.7 mg/dL (ref 2.5–4.6)

## 2022-12-14 NOTE — Progress Notes (Signed)
NAME:  Joshua Bishop, MRN:  [id number], DOB:  [birth year], LOS: 8 ADMISSION DATE: 12/06/2022, CONSULTATION DATE: 12/06/2022 REFERRING MD: Lynelle Doctor - EDP, CHIEF COMPLAINT: Altered mental status  History of Present Illness:  42 year old male who presented to [facility] ED 6/21 for AMS x 2-3 days. PMHx significant for anxiety/panic attacks, recent admission for traumatic [facility] 09/2022. Family history of Huntington's chorea (father deceased from this illness).  Patient is intubated and sedated, therefore history is taken from chart review/patient's mother. 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 Mother urged him to go to hospital but he was refusing, on 6/21 noted to be severely altered with decreased responsiveness, his mother called EMS and he was brought to the emergency department.  In the ED, patient was noted to be agitated, restless, trying to take out his IV lines. CT Head initially unable to be completed because of restlessness and agitation, he was given Valium 10 mg without much help, eventually intubated for airway protection/ CT Head showed large frontoparietal left-sided subacute subdural hematoma with severe cerebral edema and left-sided uncal herniation.  NSGY was consulted and patient was taken to OR for crani/evacuation of subdural hematoma [name]).  PCCM was consulted for evaluation/medical management and admission to ICU  Pertinent Medical History:   Past Medical History:  Diagnosis Date   Panic attack    Polysubstance abuse (HCC)    Cocaine, BZD   SAH (subarachnoid hemorrhage) (HCC) 09/2022   Traumatic   Significant Hospital Events: Including procedures, antibiotic start and stop dates in addition to other pertinent events   6/21 - Presented to [facility] ED for AMS. CT Head with large L frontoparietal convexity SDH up to 20mm in coronal plane, 18mm L to R shift, L uncal herniation/torsion of brainstem and  entrapment of lateral ventricles. CT C-spine without acute fracture. Taken to OR for crani/hematoma evacuation [name]). 6/22 - Repeat CT Head (postop) with evacuated SDH, improved midline shift (reduced to 6mm). 6/23 - Extubated to 3LNC. 6/24 - Agitated requiring increased sedation, CIWA started. 6/25 - Persistent agitated, severe multifactorial encephalopathy; clonidine and phenobarb started to attempt to wean from Precedex. 6/27 extreme agitation despite above measures. Started on ketamine  Interim History / Subjective:  Weaned off ketamine. Had great day yesterday but still some agitation overnight. Was able to eat some yesterday  Objective   Blood pressure 98/62, pulse 61, temperature (!) 97.1 F (36.2 C), temperature source Axillary, resp. rate 16, height 5\' 10"  (1.778 m), weight 74 kg, SpO2 98 %.        Intake/Output Summary (Last 24 hours) at 12/14/2022 4098 Last data filed at 12/14/2022 0700 Gross per 24 hour  Intake 1091.8 ml  Output 2100 ml  Net -1008.2 ml    Filed Weights   12/12/22 0700 12/13/22 0500 12/14/22 0500  Weight: 75.2 kg 74.8 kg 74 kg   Physical Examination: Young man sleeping Craniotomy site looks okay Lungs clear, breathing nonlabored Heart sounds regular, ext warm Abd soft, hypoactive BS Moved everything to command earlier per RN  Labs are fine, will stop checking CBG remain borderline on D10 @ 20cc/hr due to inconsistent PO  Assessment & Plan:   Left large subacute subdural hematoma Cerebral edema with brain compression, midline shift of 18 mm - s/p mannitol and 3% NS Left-sided uncal herniation S/p left hemicraniectomy for SDH evacuation and bur hole placement 6/21 Acute respiratory insufficiency - s/p intubation 6/21 with subsequent extubation 6/23  Acute encephalopathy due to subdural hematoma + ICU delirium - also likely a component of undiagnosed psychiatric illness given his hx of ED presentations and per report self medicating as  outpatient Polysubstance abuse - Patient's UDS is positive for cocaine and benzos Hx panic attacks  - Continue clonidine patch, phenobarb taper, BID seroquel (at bedtime>>AM dose), at bedtime rozerem - Continue precedex titrated to RASS 0, ativan/haldol PRN - Restraints to prevent self harm - Encourage PO, daily activity, day/night cycles, and family visitation - Once we get handle on this delirium and off precedex, he would be a good CIR candidate - No head imaging needed unless focal neurological findings, mother reassured regarding this  Best Practice (right click and "Reselect all SmartList Selections" daily)   Diet/type: encourage PO (ripped out cortrak) DVT prophylaxis: SCD Please check Monday with NSGY team to see if we can start lovenox GI prophylaxis: DC ppi Central venous access:  N/A Foley:  N/A Code Status:  full code Last date of multidisciplinary goals of care discussion [updated fiance and mother at bedside]  33 min cc time Myrla Halsted MD PCCM [place] [facility] For pager details, please see AMION or use Epic chat  After 1900, please call [facility] for cross coverage needs 12/14/2022, 9:03 AM

## 2022-12-14 NOTE — Progress Notes (Signed)
Patient ID: Joshua Bishop, male   DOB: 1981/03/06, 42 y.o.   MRN: [id number] Overall stable, had a good day yesterday walking, MAEx4, incision CDI, CCM

## 2022-12-14 NOTE — Progress Notes (Signed)
NEUROSURGERY PROGRESS NOTE  Patient doing well, less agitated on precedex gtt. Continue supportive care.   Temp:  [97.1 F (36.2 C)-98.8 F (37.1 C)] 97.1 F (36.2 C) (06/29 0800) Pulse Rate:  [61-93] 62 (06/29 0900) Resp:  [13-24] 17 (06/29 0900) BP: (90-122)/(57-88) 90/57 (06/29 0900) SpO2:  [80 %-100 %] 97 % (06/29 0900) Weight:  [74 kg] 74 kg (06/29 0500)    Sherryl Manges, NP 12/14/2022 9:44 AM

## 2022-12-15 DIAGNOSIS — S065XAA Traumatic subdural hemorrhage with loss of consciousness status unknown, initial encounter: Secondary | ICD-10-CM | POA: Diagnosis not present

## 2022-12-15 LAB — GLUCOSE, CAPILLARY
Glucose-Capillary: 131 mg/dL — ABNORMAL HIGH (ref 70–99)
Glucose-Capillary: 212 mg/dL — ABNORMAL HIGH (ref 70–99)

## 2022-12-15 MED ORDER — CLONAZEPAM 1 MG PO TABS
1.0000 mg | ORAL_TABLET | Freq: Three times a day (TID) | ORAL | Status: DC
  Administered 2022-12-15 – 2022-12-17 (×8): 1 mg via ORAL
  Filled 2022-12-15 (×8): qty 1

## 2022-12-15 MED ORDER — CHLORHEXIDINE GLUCONATE CLOTH 2 % EX PADS
6.0000 | MEDICATED_PAD | Freq: Every day | CUTANEOUS | Status: DC
  Administered 2022-12-16: 6 via TOPICAL

## 2022-12-15 MED ORDER — POTASSIUM CHLORIDE 10 MEQ/100ML IV SOLN: 10.0000 meq | INTRAVENOUS | Status: DC

## 2022-12-15 MED ORDER — POTASSIUM CHLORIDE CRYS ER 20 MEQ PO TBCR
40.0000 meq | EXTENDED_RELEASE_TABLET | Freq: Once | ORAL | Status: AC
  Administered 2022-12-15: 40 meq via ORAL
  Filled 2022-12-15: qty 2

## 2022-12-15 MED ORDER — HALOPERIDOL LACTATE 5 MG/ML IJ SOLN
5.0000 mg | Freq: Four times a day (QID) | INTRAMUSCULAR | Status: DC | PRN
  Administered 2022-12-15 – 2022-12-16 (×2): 5 mg via INTRAVENOUS
  Filled 2022-12-15 (×2): qty 1

## 2022-12-15 MED ORDER — PROMETHAZINE HCL 25 MG PO TABS: 12.5000 mg | ORAL_TABLET | ORAL | Status: DC | PRN

## 2022-12-15 NOTE — Progress Notes (Addendum)
Pharmacy Electrolyte Replacement  Recent Labs:  Recent Labs    12/14/22 0224  K 3.6  MG 2.1  PHOS 3.7  CREATININE 0.80    Low Critical Values (K </= 2.5, Phos </= 1, Mg </= 1) Present: None  K 3.6 not replaced yesterday.   MD Contacted: n/a  Plan: KCl 40 mEq PO x1.  F/up in AM

## 2022-12-15 NOTE — Progress Notes (Signed)
NAME:  Joshua Bishop, MRN:  [id number], DOB:  08-27-1980, LOS: 9 ADMISSION DATE: 12/06/2022, CONSULTATION DATE: 12/06/2022 REFERRING MD: Lynelle Doctor - EDP, CHIEF COMPLAINT: Altered mental status  History of Present Illness:  42 year old male who presented to [facility] ED 6/21 for AMS x 2-3 days. PMHx significant for anxiety/panic attacks, recent admission for traumatic [facility] 09/2022. Family history of Huntington's chorea (father deceased from this illness).  Patient is intubated and sedated, therefore history is taken from chart review/patient's mother. 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 Mother urged him to go to hospital but he was refusing, on 6/21 noted to be severely altered with decreased responsiveness, his mother called EMS and he was brought to the emergency department.  In the ED, patient was noted to be agitated, restless, trying to take out his IV lines. CT Head initially unable to be completed because of restlessness and agitation, he was given Valium 10 mg without much help, eventually intubated for airway protection/ CT Head showed large frontoparietal left-sided subacute subdural hematoma with severe cerebral edema and left-sided uncal herniation.  NSGY was consulted and patient was taken to OR for crani/evacuation of subdural hematoma [name]).  PCCM was consulted for evaluation/medical management and admission to ICU  Pertinent Medical History:   Past Medical History:  Diagnosis Date   Panic attack    Polysubstance abuse (HCC)    Cocaine, BZD   SAH (subarachnoid hemorrhage) (HCC) 09/2022   Traumatic   Significant Hospital Events: Including procedures, antibiotic start and stop dates in addition to other pertinent events   6/21 - Presented to [facility] ED for AMS. CT Head with large L frontoparietal convexity SDH up to 20mm in coronal plane, 18mm L to R shift, L uncal herniation/torsion of brainstem and  entrapment of lateral ventricles. CT C-spine without acute fracture. Taken to OR for crani/hematoma evacuation [name]). 6/22 - Repeat CT Head (postop) with evacuated SDH, improved midline shift (reduced to 6mm). 6/23 - Extubated to 3LNC. 6/24 - Agitated requiring increased sedation, CIWA started. 6/25 - Persistent agitated, severe multifactorial encephalopathy; clonidine and phenobarb started to attempt to wean from Precedex. 6/27 extreme agitation despite above measures. Started on ketamine 6/28-29 ketamine wean, adjustments to PO meds  Interim History / Subjective:  Remains precedex-dependent. Good day yesterday, eating a ton this am. Denies complaints but restless.  Objective   Blood pressure 98/77, pulse (!) 57, temperature 97.7 F (36.5 C), temperature source Oral, resp. rate 14, height 5\' 10"  (1.778 m), weight 74.7 kg, SpO2 96 %.        Intake/Output Summary (Last 24 hours) at 12/15/2022 0758 Last data filed at 12/15/2022 0700 Gross per 24 hour  Intake 1168.97 ml  Output 2300 ml  Net -1131.03 ml    Filed Weights   12/13/22 0500 12/14/22 0500 12/15/22 0437  Weight: 74.8 kg 74 kg 74.7 kg   Physical Examination: Restless young man sitting up in bed Crani site looks good Moves all ext to command Redirectable RASS +1 Lungs clear Abd soft Heart sounds brady, no murmur  CBG ok No new labs  Assessment & Plan:   Left large subacute subdural hematoma Cerebral edema with brain compression, midline shift of 18 mm - s/p mannitol and 3% NS Left-sided uncal herniation S/p left hemicraniectomy for SDH evacuation and bur hole placement 6/21 Acute respiratory insufficiency - s/p intubation 6/21 with subsequent extubation 6/23 Acute encephalopathy due to subdural hematoma + ICU  delirium - also likely a component of undiagnosed psychiatric illness given his hx of ED presentations and per report self medicating as outpatient Polysubstance abuse - Patient's UDS is positive for  cocaine and benzos Hx panic attacks  - Continue clonidine patch, phenobarb taper, BID seroquel (at bedtime>>AM dose), at bedtime rozerem; add standing klonipin to facilitate getting off precedex - Continue precedex titrated to RASS 0, ativan/haldol PRN - Restraints have been off since yesterday (6/29) - Encourage PO, daily activity, day/night cycles, and family visitation - Once we get handle on this delirium and off precedex, he would be a good CIR candidate - No head imaging needed unless focal neurological findings, mother reassured regarding this 6/29 - Rns to try to exercise him as much as possible today to get energy levels lower for better sleep  Best Practice (right click and "Reselect all SmartList Selections" daily)   Diet/type: encourage PO (ripped out cortrak) DVT prophylaxis: SCD Please check Monday with NSGY team to see if we can start lovenox GI prophylaxis: DC ppi 6/29 Central venous access:  N/A Foley:  N/A Code Status:  full code Last date of multidisciplinary goals of care discussion [updated fiance and mother at bedside 6/29]  31 min cc time Myrla Halsted MD PCCM [place] [facility] For pager details, please see AMION or use Epic chat  After 1900, please call [facility] for cross coverage needs 12/15/2022, 7:58 AM

## 2022-12-15 NOTE — Progress Notes (Signed)
NEUROSURGERY PROGRESS NOTE  Doing well, no acute events overnight. Seems calm on precedex gtt.  Temp:  [97.7 F (36.5 C)-99.2 F (37.3 C)] 97.8 F (36.6 C) (06/30 0800) Pulse Rate:  [55-97] 67 (06/30 0800) Resp:  [14-28] 19 (06/30 0800) BP: (82-118)/(56-104) 98/77 (06/30 0700) SpO2:  [81 %-100 %] 95 % (06/30 0800) Weight:  [74.7 kg] 74.7 kg (06/30 0437)    Sherryl Manges, NP 12/15/2022 9:51 AM

## 2022-12-16 DIAGNOSIS — F14921 Cocaine use, unspecified with intoxication delirium: Secondary | ICD-10-CM

## 2022-12-16 DIAGNOSIS — F410 Panic disorder [episodic paroxysmal anxiety] without agoraphobia: Secondary | ICD-10-CM

## 2022-12-16 DIAGNOSIS — F1910 Other psychoactive substance abuse, uncomplicated: Secondary | ICD-10-CM

## 2022-12-16 DIAGNOSIS — Z8679 Personal history of other diseases of the circulatory system: Secondary | ICD-10-CM | POA: Diagnosis not present

## 2022-12-16 DIAGNOSIS — E440 Moderate protein-calorie malnutrition: Secondary | ICD-10-CM | POA: Insufficient documentation

## 2022-12-16 DIAGNOSIS — S065XAA Traumatic subdural hemorrhage with loss of consciousness status unknown, initial encounter: Secondary | ICD-10-CM | POA: Diagnosis not present

## 2022-12-16 MED ORDER — PHENOBARBITAL 32.4 MG PO TABS: 32.4000 mg | ORAL_TABLET | Freq: Three times a day (TID) | ORAL | Status: DC

## 2022-12-16 MED ORDER — LORAZEPAM 2 MG/ML IJ SOLN
2.0000 mg | INTRAMUSCULAR | Status: DC | PRN
  Filled 2022-12-16: qty 1

## 2022-12-16 MED ORDER — MELATONIN 3 MG PO TABS
3.0000 mg | ORAL_TABLET | Freq: Every evening | ORAL | Status: DC | PRN
  Administered 2022-12-16: 3 mg via ORAL
  Filled 2022-12-16: qty 1

## 2022-12-16 MED ORDER — PHENOBARBITAL 64.8 MG PO TABS
64.8000 mg | ORAL_TABLET | Freq: Three times a day (TID) | ORAL | Status: DC
  Administered 2022-12-16: 64.8 mg via ORAL
  Filled 2022-12-16: qty 2

## 2022-12-16 MED ORDER — HEPARIN SODIUM (PORCINE) 5000 UNIT/ML IJ SOLN
5000.0000 [IU] | Freq: Three times a day (TID) | INTRAMUSCULAR | Status: DC
  Administered 2022-12-16 – 2022-12-18 (×5): 5000 [IU] via SUBCUTANEOUS
  Filled 2022-12-16 (×5): qty 1

## 2022-12-16 MED ORDER — LORAZEPAM 1 MG PO TABS
2.0000 mg | ORAL_TABLET | ORAL | Status: DC | PRN
  Administered 2022-12-16 – 2022-12-18 (×6): 2 mg via ORAL
  Filled 2022-12-16 (×6): qty 2

## 2022-12-16 NOTE — Progress Notes (Signed)
Nutrition Follow-up  DOCUMENTATION CODES:   Non-severe (moderate) malnutrition in context of social or environmental circumstances  INTERVENTION:   Continue Ensure Enlive TID  Encourage PO intake   NUTRITION DIAGNOSIS:   Moderate Malnutrition related to social / environmental circumstances (substance abuse) as evidenced by moderate muscle depletion, moderate fat depletion. Ongoing.   GOAL:   Patient will meet greater than or equal to 90% of their needs Addressing with diet advancement and ONS supplementation  MONITOR:   PO intake, Supplement acceptance, Weight trends  REASON FOR ASSESSMENT:   Ventilator, Consult Enteral/tube feeding initiation and management  ASSESSMENT:   42 year old male who presented to the ED on 6/21 with AMS. PMH of Huntington's, EtOH abuse, polysubstance abuse, anxiety, recent admission for traumatic subarachnoid hemorrhage. Pt required intubation. Pt admitted with large L subacute SDH, cerebral edema with brain compression, midline shift, and left-sided uncal herniation.  Pt discussed during ICU rounds and with RN.  Per RN pt is eating very well, 100% meal completion.  Pt remains in the ICU for precedex infusion  Pt unable to provide specific wt/intake hx PTA. He does report weight loss PTA and notices a change in his muscle mass.  Per chart review pt with a 7.5% weight loss since 4/24.    Medications reviewed and include: pepcid, folic acid, MVI with minerals, phenobarbital, thiamine  Precedex   Labs reviewed:  CBG's: 72-212  NUTRITION - FOCUSED PHYSICAL EXAM:  Flowsheet Row Most Recent Value  Orbital Region Moderate depletion  Upper Arm Region Moderate depletion  Thoracic and Lumbar Region Moderate depletion  Buccal Region Moderate depletion  Temple Region Moderate depletion  Clavicle Bone Region Moderate depletion  Clavicle and Acromion Bone Region Moderate depletion  Scapular Bone Region Moderate depletion  Dorsal Hand Mild  depletion  Patellar Region No depletion  Anterior Thigh Region No depletion  Posterior Calf Region No depletion  Edema (RD Assessment) None  Hair Reviewed  Eyes Reviewed  Mouth Reviewed  Skin Reviewed  Nails Reviewed       Diet Order:   Diet Order             Diet regular Room service appropriate? Yes with Assist; Fluid consistency: Thin  Diet effective now                   EDUCATION NEEDS:   Education needs have been addressed  Skin:  Skin Assessment: Skin Integrity Issues: Skin Integrity Issues:: Incisions Incisions: head  Last BM:  6/30; type 5  Height:   Ht Readings from Last 1 Encounters:  12/06/22 5\' 10"  (1.778 m)    Weight:   Wt Readings from Last 1 Encounters:  12/16/22 77.6 kg    Ideal Body Weight:     BMI:  Body mass index is 24.55 kg/m.  Estimated Nutritional Needs:   Kcal:  2200-2400  Protein:  105-120 grams  Fluid:  >2.0 L  Jerita Wimbush P., RD, LDN, CNSC See AMiON for contact information

## 2022-12-16 NOTE — Progress Notes (Signed)
Phenobarbital out of stock in Pyxis, scheduled 0000 dose sent up from pharmacy.   0.61mL Phenobarbital given and documented in [facility], 0.79mL wasted in stericycle in unit medication room with Ann Maki. Freddy Finner, RN

## 2022-12-16 NOTE — Consult Note (Signed)
Physical Medicine and Rehabilitation Consult Reason for Consult: Rehab Referring Physician: Dr. Adelene Idler   HPI: Joshua Bishop is a 42 y.o. 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  Patient was reported to have altered speech and had gait changes.  He was very agitated and restless initially in the ED.  He had a CT scan of his head which showed large left frontal parietal convexity subdural hematoma with 18 mm of rightward midline shift and left uncal herniation with portion of the brainstem and entrapment of the lateral ventricles at the level of the third ventricle.  On 6/21 patient had craniotomy for hematoma evacuation by Dr. Hoyt Koch.  He was extubated on 6/23.  Postoperatively he had agitated delirium due to his TBI and polysubstance abuse.  He was placed on CIWA protocol.  He is treated with clonidine patch, phenobarbital taper, Seroquel, Klonopin in order to attempt wean off Precedex gtt. has Ativan and Haldol as needed.  Has also been given ketamine. Precedex gtt has been discontinued.  Patient was seen by psychiatry today.  Patient is been evaluated by PT and OT and found to have functional deficits.  Patient reports he lives with his mother and has a fianc.  Reports his mom does not work and can assist him after discharge.  He says he lives in a 2 story home with 3 steps to enter.  He says he could stay in a bedroom on the first floor if needed.   Review of Systems  Constitutional:  Negative for chills and fever.  Eyes:  Negative for double vision.  Respiratory:  Negative for shortness of breath.   Cardiovascular:  Negative for chest pain.  Gastrointestinal:  Negative for abdominal pain.  Genitourinary: Negative.   Musculoskeletal: Negative.   Neurological:  Positive for speech change. Negative for sensory change and weakness.    Past Medical History:  Diagnosis Date   Panic attack    Polysubstance abuse (HCC)    Cocaine, BZD   SAH (subarachnoid hemorrhage) (HCC) 09/2022   Traumatic   Past Surgical History:  Procedure Laterality Date   CRANIOTOMY Left 12/06/2022   Procedure: CRANIOTOMY HEMATOMA EVACUATION SUBDURAL;  Surgeon: Bedelia Person, MD;  Location: [facility] OR;  Service: Neurosurgery;  Laterality: Left;   Family History  Problem Relation Age of Onset   Cancer Other    Social History:  reports that he has been smoking cigarettes. He has been smoking an average of .5 packs per day. He has never used smokeless tobacco. He reports current alcohol use. He reports that he does not use drugs. Allergies:  Allergies  Allergen Reactions   Zidovudine Other (See Comments)    `unknown    Medications Prior to Admission  Medication Sig Dispense Refill   ibuprofen (ADVIL,MOTRIN) 200 MG tablet Take 600 mg by mouth every 6 (six) hours as needed for moderate pain.      Home: Home Living Family/patient expects to be discharged to:: Private residence Living Arrangements: Parent Available Help at Discharge: Family Type of Home: Mobile home Home Access: Stairs to enter Secretary/administrator of Steps: a few Home Layout: One level Bathroom Shower/Tub: Tub/shower unit, Health visitor: Standard Bathroom Accessibility: Yes Home Equipment: None Additional Comments: no family present at time of OT evaluation. 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 Functional Status:  Mobility: Bed Mobility Overal bed mobility: Needs Assistance Bed Mobility: Supine to Sit Rolling: Min guard Sidelying to sit: Total assist, +2 for physical assistance Supine to sit: Min guard Sit to supine: Min assist, +2  for safety/equipment General bed mobility comments: cues for monitor but otherwise following commands and able to sequence Transfers Overall transfer level: Needs assistance Equipment used: Rolling walker (2 wheels) Transfers: Sit to/from Stand Sit to Stand: Min guard General transfer comment: pt is able to power up without physical (A) Ambulation/Gait Ambulation/Gait assistance: Mod assist, +2 physical assistance Gait Distance (Feet): 150 Feet Assistive device: Rolling walker (2 wheels) Gait Pattern/deviations: Step-through pattern, Ataxic, Scissoring, Drifts right/left, Wide base of support, Trunk flexed General Gait Details: assist for walker management, pt moving too quickly at times for safety due to IV, assist for obstacles with RW, etc    ADL: ADL Overall ADL's : Needs assistance/impaired Eating/Feeding: Set up Eating/Feeding Details (indicate cue type and reason): to drink a soda Grooming: Wash/dry hands, Min guard, Standing Grooming Details (indicate cue type and reason): pt needs steady assist at sink, safety for head, and cues to sequence. pt thought bandage on arm was tooth paste because it was white in color. pt also started to brush teeth with no tooth paste but had the paste in his left hand. Upper Body Dressing : Minimal assistance Toilet Transfer: Min guard, Ambulation, Regular Toilet Toileting- Clothing Manipulation and Hygiene: Min guard Toileting - Clothing Manipulation Details (indicate cue type and reason): standing for void and then bends over to clean edge of commode Functional mobility during ADLs: Minimal assistance General ADL Comments: pt noted to have HR sustained in 160s but reports feeling calm and not anxious. pt reports feeling his hands lock up with panic attacks. pt with improved gait and attention with familiar music being planned for patient  Cognition: Cognition Overall Cognitive Status: Impaired/Different from baseline Orientation Level:  Oriented to person, Oriented to time, Oriented to situation, Disoriented to place [employer] Scales of Cognitive Functioning: Confused, Appropriate Cognition Arousal/Alertness: Awake/alert Behavior During Therapy: Restless Overall Cognitive Status: Impaired/Different from baseline Area of Impairment: Safety/judgement, Awareness Current Attention Level: Sustained Memory: Decreased short-term memory Following Commands: Follows one step commands consistently, Follows multi-step commands inconsistently Safety/Judgement: Decreased awareness of deficits, Decreased awareness of safety Awareness: Emergent Problem Solving: Slow processing, Requires verbal cues General Comments: pt aware of date/ day of week and location [place]. Pt has poor recall of name of the hospital but he does call it [facility]" Pt aware that he had a fall and that he had brain surgery. pt showing awareness of incision and avoids touching it without cues.  Blood pressure 128/88, pulse (!) 112, temperature 98.5 F (36.9 C), temperature source Oral, resp. rate (!) 34, height 5\' 10"  (1.778 m), weight 77.6 kg, SpO2 98 %. Physical Exam  No results found for this or any previous visit (from the past 24 hour(s)). No results found.    General: Alert and oriented x 3, No apparent distress HEENT: Left craniotomy incision with staples intact, MMM Neck: Supple without JVD or lymphadenopathy Heart: Reg rate and rhythm. No murmurs rubs or gallops Chest: CTA bilaterally without wheezes, rales, or rhonchi; no distress Abdomen: Soft, non-tender, non-distended, bowel sounds positive. Extremities: No clubbing, cyanosis, or edema. Pulses are 2+ Psych: Pt's affect is appropriate. Pt is cooperative Skin:  Clean and intact without signs of breakdown Neuro: Alert and oriented x 4, follows simple commands, impulsive, confused, some perseveration noted with questions, memory-remembers 1 out of 3 words 5 minutes later, cranial nerves II  through XII intact although appears to have some slight left eye ptosis, Not  Agitated at this time Strength 5 out of 5 in all 4 extremities Sensation intact light touch in all 4 extremities Finger-nose intact bilaterally Musculoskeletal: No joint swelling or tenderness noted No abnormal tone   Assessment/Plan: Diagnosis: Left large subacute subdural hematoma with cerebral edema and midline shift, left-sided uncal herniation status post left craniotomy for subdural 6/21 per Dr. Hoyt Koch.  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 Yes Co-Morbidities requiring supervision/potential complications:  -Polysubstance abuse, panic attacks Due to bladder management, bowel management, safety, skin/wound care, disease managYesement, medication administration, pain management, and patient education, does the patient require 24 hr/day rehab nursing? Yes Does the patient require coordinated care of a physician, rehab nurse, therapy disciplines of PT OT and SLP to address physical and functional deficits in the context of the above medical diagnosis(es)? Yes Addressing deficits in the following areas: balance, endurance, locomotion, strength, transferring, bowel/bladder control, bathing, dressing, feeding, grooming, toileting, cognition, speech, language, and psychosocial support Can the patient actively participate in an intensive therapy program of at least 3 hrs of therapy per day at least 5 days per week? Yes The potential for patient to make measurable gains while on inpatient rehab is excellent Anticipated functional outcomes upon discharge from inpatient rehab are supervision  with PT, supervision with OT, supervision with SLP. Estimated rehab length of stay to reach the above functional goals is:  12-14 Anticipated discharge destination: Home Overall Rehab/Functional Prognosis: excellent  POST ACUTE RECOMMENDATIONS: This patient's condition is appropriate for continued rehabilitative care in the following setting: CIR Patient has agreed to participate in recommended program. Yes Note that insurance prior authorization may be required for reimbursement for recommended care.  Comment: I think you be a good candidate for CIR when medically stable, would like to confirm disposition plan.   MEDICAL RECOMMENDATIONS: Attempt to minimize overstimulation.  Avoid multiple people in his room at the same time when possible.  Speak briefly and directly.  Attempt to keep speech soft and calm and avoid sudden movements.  To keep distracting noise to minimal.  Allow time for processing of statements.   I have personally performed a face to face diagnostic evaluation of this patient. Additionally, I have examined the patient's medical record including any pertinent labs and radiographic images. If the physician assistant has documented in this note, I have reviewed and edited or otherwise concur with the physician assistant's documentation.  Thanks,  Fanny Dance, MD 12/16/2022

## 2022-12-16 NOTE — Progress Notes (Signed)
Occupational Therapy Treatment Patient Details Name: Joshua Bishop MRN: [id number] DOB: 11-05-1980 Today's Date: 12/16/2022   History of present illness 42 yo male admitted 12/06/22 due to AMS, found to have large L subacute SDH, now s/p L crani on 6/21 for evacuation of SDH.  PMH anxiety, panic attacks, 04/24 SAH.   OT comments  Pt progressing well with therapy this session with improved balance from prior session to min (A) with transfers. Pt oriented x3 and showing awareness to reason for admission. Pt expressed during session strong faith and gratitude to the medical team. Recommendation for Patient will benefit from intensive inpatient follow up therapy, >3 hours/day.  Pt progressing quickly at this time. Pt could benefit form executive function cognitive and higher dynamic balance challenges next session.    Recommendations for follow up therapy are one component of a multi-disciplinary discharge planning process, led by the attending physician.  Recommendations may be updated based on patient status, additional functional criteria and insurance authorization.    Assistance Recommended at Discharge    Patient can return home with the following  A little help with walking and/or transfers;A little help with bathing/dressing/bathroom;Assist for transportation   Equipment Recommendations  None recommended by OT    Recommendations for Other Services Rehab consult    Precautions / Restrictions Precautions Precautions: Fall Precaution Comments: posey belt, CIWA Restrictions Weight Bearing Restrictions: No       Mobility Bed Mobility Overal bed mobility: Needs Assistance Bed Mobility: Supine to Sit     Supine to sit: Min guard     General bed mobility comments: cues for monitor but otherwise following commands and able to sequence    Transfers Overall transfer level: Needs assistance   Transfers: Sit to/from Stand Sit to Stand: Min guard           General  transfer comment: pt is able to power up without physical (A)     Balance Overall balance assessment: Needs assistance   Sitting balance-Leahy Scale: Good       Standing balance-Leahy Scale: Fair                 High Level Balance Comments: pt noted to clip door frame with R door frame exiting the room. pt able to static stand with vision occlusion. pt able to static stand with visual scanning R and L without LOB. pt demonstrate divided attention. pt dual static with signing and following direction changes at min (A)           ADL either performed or assessed with clinical judgement   ADL Overall ADL's : Needs assistance/impaired     Grooming: Wash/dry hands;Min Production designer, theatre/television/film: Min guard;Ambulation;Regular Social worker and Hygiene: Min guard Toileting - Clothing Manipulation Details (indicate cue type and reason): standing for void and then bends over to clean edge of commode     Functional mobility during ADLs: Minimal assistance General ADL Comments: pt noted to have HR sustained in 160s but reports feeling calm and not anxious. pt reports feeling his hands lock up with panic attacks. pt with improved gait and attention with familiar music being planned for patient    Extremity/Trunk Assessment Upper Extremity Assessment Upper Extremity Assessment: Generalized weakness   Lower Extremity Assessment Lower Extremity Assessment: Defer to PT evaluation        Vision  Perception     Praxis      Cognition Arousal/Alertness: Awake/alert Behavior During Therapy: Restless Overall Cognitive Status: Impaired/Different from baseline Area of Impairment: Safety/judgement, Awareness               Rancho Levels of Cognitive Functioning [place] Scales of Cognitive Functioning: Confused, Appropriate         Safety/Judgement: Decreased awareness of deficits, Decreased  awareness of safety Awareness: Emergent   General Comments: pt aware of date/ day of week and location [place]. Pt has poor recall of name of the hospital but he does call it [facility]" Pt aware that he had a fall and that he had brain surgery. pt showing awareness of incision and avoids touching it without cues. [place] Scales of Cognitive Functioning: Confused, Appropriate      Exercises      Shoulder Instructions       General Comments HR sustained 160 with all transfers. pt thanking therapists for coming to see him and eager to work with therapy. pt denies any symptoms that are his based line for anxiety. Pt has no awareness to increase HR at this time    Pertinent Vitals/ Pain       Pain Assessment Pain Assessment: No/denies pain  Home Living                                          Prior Functioning/Environment              Frequency  Min 2X/week        Progress Toward Goals  OT Goals(current goals can now be found in the care plan section)  Progress towards OT goals: Progressing toward goals  Acute Rehab OT Goals Patient Stated Goal: to get home to 42 year old daughter OT Goal Formulation: Patient unable to participate in goal setting Time For Goal Achievement: 12/24/22 Potential to Achieve Goals: Good ADL Goals Pt Will Perform Upper Body Bathing: with set-up;sitting Pt Will Perform Lower Body Bathing: with set-up;sit to/from stand Pt Will Perform Upper Body Dressing: with set-up;sitting Pt Will Perform Lower Body Dressing: with set-up;with min assist;sit to/from stand Pt Will Transfer to Toilet: with min assist;ambulating;regular height toilet Additional ADL Goal #1: pt will complete 3 step command 50% of attempts Additional ADL Goal #2: pt will complete 5 step pathfinding task with written instructions  Plan Discharge plan remains appropriate    Co-evaluation          OT goals addressed during session: ADL's and  self-care;Strengthening/ROM      AM-PAC OT "6 Clicks" Daily Activity     Outcome Measure   Help from another person eating meals?: A Little Help from another person taking care of personal grooming?: A Little Help from another person toileting, which includes using toliet, bedpan, or urinal?: A Little Help from another person bathing (including washing, rinsing, drying)?: A Little Help from another person to put on and taking off regular upper body clothing?: A Little Help from another person to put on and taking off regular lower body clothing?: A Little 6 Click Score: 18    End of Session Equipment Utilized During Treatment: Gait belt  OT Visit Diagnosis: Unsteadiness on feet (R26.81);Muscle weakness (generalized) (M62.81);Cognitive communication deficit (R41.841)   Activity Tolerance Patient tolerated treatment well   Patient Left with call bell/phone within reach;with nursing/sitter in room;with restraints  reapplied;in chair (posey belt and call bell on)   Nurse Communication Mobility status;Precautions        Time: 0981-1914 OT Time Calculation (min): 26 min  Charges: OT General Charges $OT Visit: 1 Visit OT Treatments $Self Care/Home Management : 8-22 mins   Brynn, OTR/L  Acute Rehabilitation Services Office: [phone] .   Mateo Flow 12/16/2022, 11:28 AM

## 2022-12-16 NOTE — TOC Progression Note (Signed)
Transition of Care [facility]) - Progression Note    Patient Details  Name: Joshua Bishop MRN: [id number] Date of Birth: 27-Oct-1980  [facility] CM/SW Contact  Mearl Latin, LCSW Phone Number: 12/16/2022, 9:25 AM  Clinical Narrative:    CSW continuing to follow for medical progression.      Barriers to Discharge: Continued Medical Work up  Expected Discharge Plan and Services In-house Referral: Clinical Social Work     Living arrangements for the past 2 months: Single Family Home                                       Social Determinants of Health (SDOH) Interventions SDOH Screenings   Tobacco Use: High Risk (12/11/2022)    Readmission Risk Interventions     No data to display

## 2022-12-16 NOTE — Progress Notes (Signed)
eLink Physician-Brief Progress Note Patient Name: Joshua Bishop DOB: 1980-08-29 MRN: [id number]   Date of Service  12/16/2022  HPI/Events of Note  Pt had been agitated, ripped out all IV access.   eICU Interventions  Switched phenobarbital IV taper to PO.  Will continue to monitor closely.         Sivan Quast M DELA CRUZ 12/16/2022, 10:57 PM

## 2022-12-16 NOTE — Progress Notes (Signed)
NAME:  Joshua Bishop, MRN:  [id number], DOB:  [birth year], LOS: 10 ADMISSION DATE:  12/06/2022, CONSULTATION DATE:  12/06/2022 REFERRING MD:  Lynelle Doctor- EDP, CHIEF COMPLAINT:  AMS   History of Present Illness:  42 year old male who presented to [facility] ED 6/21 for AMS x 2-3 days. PMHx significant for anxiety/panic attacks, recent admission for traumatic [facility] 09/2022. Family history of Huntington's chorea (father deceased from this illness).   Patient is intubated and sedated, therefore history is taken from chart review/patient's mother. 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 Mother urged him to go to hospital but he was refusing, on 6/21 noted to be severely altered with decreased responsiveness, his mother called EMS and he was brought to the emergency department.   In the ED, patient was noted to be agitated, restless, trying to take out his IV lines. CT Head initially unable to be completed because of restlessness and agitation, he was given Valium 10 mg without much help, eventually intubated for airway protection/ CT Head showed large frontoparietal left-sided subacute subdural hematoma with severe cerebral edema and left-sided uncal herniation.  NSGY was consulted and patient was taken to OR for crani/evacuation of subdural hematoma Joshua Bishop).   PCCM was consulted for evaluation/medical management and admission to ICU  Pertinent  Medical History  Panic attacks, Polysubstance abuse (cocaine, BZD), SAH (traumatic)  Significant Hospital Events: Including procedures, antibiotic start and stop dates in addition to other pertinent events   6/21 - Presented to [facility] ED for AMS. CT Head with large L frontoparietal convexity SDH up to 20mm in coronal plane, 18mm L to R shift, L uncal herniation/torsion of brainstem and entrapment of lateral ventricles. CT C-spine without acute fracture. Taken to OR for crani/hematoma evacuation  Joshua Bishop). 6/22 - Repeat CT Head (postop) with evacuated SDH, improved midline shift (reduced to 6mm). 6/23 - Extubated to 3LNC. 6/24 - Agitated requiring increased sedation, CIWA started. 6/25 - Persistent agitated, severe multifactorial encephalopathy; clonidine and phenobarb started to attempt to wean from Precedex. 6/27 extreme agitation despite above measures. Started on ketamine 6/28-29 ketamine wean, adjustments to PO meds  7/1- off precedex gtt at 0630, remains stable and safe to transfer out of ICU  Interim History / Subjective:  Precedex off since 0630, ambulatory in hallway with PT/OT, calm, cooperative, and agrees with POC at this time- stable to transfer out of ICU. PRN melatonin added to assist in pts day/night orientation.   Objective   Blood pressure (!) 143/91, pulse (!) 132, temperature 98.6 F (37 C), temperature source Oral, resp. rate 19, height 5\' 10"  (1.778 m), weight 77.6 kg, SpO2 93 %.        Intake/Output Summary (Last 24 hours) at 12/16/2022 1128 Last data filed at 12/16/2022 1035 Gross per 24 hour  Intake 2303.18 ml  Output 5520 ml  Net -3216.82 ml   Filed Weights   12/14/22 0500 12/15/22 0437 12/16/22 0500  Weight: 74 kg 74.7 kg 77.6 kg    Examination:   Physical Exam Constitutional:      Appearance: He is normal weight.  HENT:     Head:     Comments: Staples to L side of head, clean dry intact    Nose: Nose normal.     Mouth/Throat:     Mouth: Mucous membranes are moist.     Pharynx: Oropharynx is clear.  Eyes:     Extraocular Movements: Extraocular movements intact.  Conjunctiva/sclera: Conjunctivae normal.     Pupils: Pupils are equal, round, and reactive to light.  Cardiovascular:     Rate and Rhythm: Normal rate and regular rhythm.     Pulses: Normal pulses.     Heart sounds: Normal heart sounds.  Pulmonary:     Effort: Pulmonary effort is normal.     Breath sounds: Normal breath sounds.  Abdominal:     General: Abdomen is flat.  Bowel sounds are normal.  Musculoskeletal:        General: Normal range of motion.     Cervical back: Normal range of motion.  Skin:    General: Skin is warm.     Capillary Refill: Capillary refill takes less than 2 seconds.  Neurological:     General: No focal deficit present.     Mental Status: He is alert and oriented to person, place, and time. Mental status is at baseline.  Psychiatric:     Comments: Sporadic behavior secondary to subdural hematoma in frontoparietal region, however remains calm off precedex     Resolved Hospital Problem list   N/A  Assessment & Plan:  Left large subacute subdural hematoma Cerebral edema with brain compression, midline shift of 18 mm - s/p mannitol and 3% NS Left-sided uncal herniation S/p left hemicraniectomy for SDH evacuation and bur hole placement 6/21 Acute encephalopathy due to subdural hematoma + ICU delirium  Notably pt likely has a component of undiagnosed psychiatric illness given his hx of ED presentations in past, as well as a report of self medicating as outpatient. It is likely that his behavior is exacerbated by the region of his subdural hematoma being frontoparietal in nature. Pt has remained off Precedex since 0630 on 7/1, and remains calm and cooperative on interview. Ambulated in hall with PT/OT without complications.   -Continue clonidine patch, BID Seroquel and klonopin -restraints have bene off since 6/29, precedex off since 7/1 at 0630 -Encourage PO, daily activity, day/night cycles, and family visitation as able -Consult to CIR for possible admission, they continue to follow -Defer further imaging unless new worrisome focal/neurological findings appear -PRN melatonin added to assist in day/night schedules   Polysubstance abuse Hx Panic Attacks -Encourage cessation from polysubstance abuse in order to mitigate associated risk factors -Seroquel, klonopin, and clonidine patch BID   Best Practice (right click and  "Reselect all SmartList Selections" daily)   Diet/type: Regular consistency (see orders) DVT prophylaxis: SCD Lines: N/A Foley:  N/A Code Status:  full code Last date of multidisciplinary goals of care discussion [12/16/22 ]  Labs   CBC: Recent Labs  Lab 12/10/22 0254 12/14/22 0224  WBC 7.0 7.0  HGB 13.3 13.6  HCT 37.3* 38.7*  MCV 86.5 87.6  PLT 228 279    Basic Metabolic Panel: Recent Labs  Lab 12/10/22 0254 12/14/22 0224  NA 136 137  K 3.6 3.6  CL 102 103  CO2 24 25  GLUCOSE 103* 101*  BUN 8 11  CREATININE 0.72 0.80  CALCIUM 8.7* 9.0  MG 2.1 2.1  PHOS 3.4 3.7   GFR: Estimated Creatinine Clearance: 126.7 mL/min (by C-G formula based on SCr of 0.8 mg/dL). Recent Labs  Lab 12/10/22 0254 12/14/22 0224  WBC 7.0 7.0    Liver Function Tests: No results for input(s): "AST", "ALT", "ALKPHOS", "BILITOT", "PROT", "ALBUMIN" in the last 168 hours. No results for input(s): "LIPASE", "AMYLASE" in the last 168 hours. No results for input(s): "AMMONIA" in the last 168 hours.  ABG  Component Value Date/Time   PHART 7.398 12/07/2022 0524   PCO2ART 40.9 12/07/2022 0524   PO2ART 135 (H) 12/07/2022 0524   HCO3 25.4 12/07/2022 0524   TCO2 27 12/07/2022 0524   ACIDBASEDEF 2.0 12/06/2022 1931   O2SAT 99 12/07/2022 0524     Coagulation Profile: No results for input(s): "INR", "PROTIME" in the last 168 hours.  Cardiac Enzymes: No results for input(s): "CKTOTAL", "CKMB", "CKMBINDEX", "TROPONINI" in the last 168 hours.  HbA1C: Hgb A1c MFr Bld  Date/Time Value Ref Range Status  12/06/2022 10:08 PM 5.1 4.8 - 5.6 % Final    Comment:    (NOTE) Pre diabetes:          5.7%-6.4%  Diabetes:              >6.4%  Glycemic control for   <7.0% adults with diabetes     CBG: Recent Labs  Lab 12/14/22 1533 12/14/22 1931 12/14/22 2334 12/15/22 0419 12/15/22 0428  GLUCAP 123* 137* 142* 212* 131*    Review of Systems:   Negative except as listed in HPI and  POC.  Past Medical History:  He,  has a past medical history of Panic attack, Polysubstance abuse (HCC), and SAH (subarachnoid hemorrhage) (HCC) (09/2022).   Surgical History:   Past Surgical History:  Procedure Laterality Date   CRANIOTOMY Left 12/06/2022   Procedure: CRANIOTOMY HEMATOMA EVACUATION SUBDURAL;  Surgeon: Bedelia Person, MD;  Location: [facility] OR;  Service: Neurosurgery;  Laterality: Left;     Social History:   reports that he has been smoking cigarettes. He has been smoking an average of .5 packs per day. He has never used smokeless tobacco. He reports current alcohol use. He reports that he does not use drugs.   Family History:  His family history includes Cancer in an other family member.   Allergies Allergies  Allergen Reactions   Zidovudine Other (See Comments)    `unknown      Home Medications  Prior to Admission medications   Medication Sig Start Date End Date Taking? Authorizing Provider  ibuprofen (ADVIL,MOTRIN) 200 MG tablet Take 600 mg by mouth every 6 (six) hours as needed for moderate pain.   Yes [provider]     Critical care time: 45 minutes     Janeece Riggers, AGACNP-BC [facility] For pager details, please see AMION or use EPIC chat After 1900, please call ELINK for cross coverage needs 12/16/2022 11:28 AM

## 2022-12-16 NOTE — Progress Notes (Signed)
Inpatient Rehab Admissions Coordinator:    CIR continues to follow. Rehab MD to consult today. Pt. Is not medically stable for CIR at this time, continues on precedex gtt. I will continue to follow for potential admit once medically ready for d/c.  Megan Salon, MS, CCC-SLP Rehab Admissions Coordinator  [phone] (celll) [phone] (office)

## 2022-12-16 NOTE — Progress Notes (Signed)
Pt agitated and ignoring the bed alarms. Pt taking off waist soft restraint. This nurse contacted Critical Care NP, was given a 1x dose of Ativan IV 2mg . Pt pulled out IV's and was unable to give medication. Pt continued to pace the hallway with security following. Pt stated he was leaving and nothing we could do about it. Pt ambulating around the halls and in his room, however was unsteady on his feet. This nurse spoke with both the pts mother and girlfriend and neither one of them stated that they would come and pick him up. Dr. Denese Killings came to see the pt and told the pt that if he had a ride home, and actual person had to come to his room and take him home then he could go. Both of his family members said no. Dr. Jovita Gamma orders for 2mg  PO ativan to help relax the pt.

## 2022-12-16 NOTE — Progress Notes (Signed)
Subjective: NAEs o/n.   Objective: Vital signs in last 24 hours: Temp:  [97.8 F (36.6 C)-98.8 F (37.1 C)] 98.8 F (37.1 C) (07/01 1439) Pulse Rate:  [59-132] 111 (07/01 1439) Resp:  [13-34] 18 (07/01 1439) BP: (92-144)/(53-97) 106/70 (07/01 1439) SpO2:  [90 %-100 %] 97 % (07/01 1439) Weight:  [77.6 kg] 77.6 kg (07/01 0500)  Intake/Output from previous day: 06/30 0701 - 07/01 0700 In: 2268.7 [P.O.:1920; I.V.:348.7] Out: 4600 [Urine:4600] Intake/Output this shift: Total I/O In: 841.1 [P.O.:840; I.V.:1.1] Out: 1220 [Urine:1220]  Awake, alert, oriented Speech fluent, appropriate CN grossly intact 5/5 BUE/BLE Wound c/d/i   Lab Results: Recent Labs    12/14/22 0224  WBC 7.0  HGB 13.6  HCT 38.7*  PLT 279   BMET Recent Labs    12/14/22 0224  NA 137  K 3.6  CL 103  CO2 25  GLUCOSE 101*  BUN 11  CREATININE 0.80  CALCIUM 9.0    Studies/Results: No results found.  Assessment/Plan: 42 yo M s/p L crani for large subacute SDH with post op neurologic recovery complicated by acute substance withdrawal and agitation and potential underlying Huntington's dx.   -Cont supportive care -Ok to start DVT ppx w/ SQH.  -Call w/ questions/concerns   Gaynor Ferreras CAYLIN Jhostin Epps 12/16/2022, 3:45 PM

## 2022-12-16 NOTE — Consult Note (Addendum)
Joshua Bishop Psychiatry Consult Evaluation  Service Date: December 16, 2022 LOS:  LOS: 10 days    Primary Psychiatric Diagnoses  GAD w/ panic attacks Nicotine use disorder 3.  Benzodiazepine Use 4.  Cocaine Use  Assessment  Joshua Bishop is a 42 y.o. male admitted medically on 12/06/2022  2:25 PM for AMS in the setting of TBI and substance use. He reports a past psychiatric history of GAD with panic attacks and tobacco use disorder. Psychiatry was consulted for "Heard voices now here with tbi and I can't get him cooperative" by Dr. Katrinka Blazing on 12/15/2022.   Joshua Bishop reports the psychiatric diagnosis of GAD with panic attacks, reporting weekly panic attacks that are characterized by a sense of impending doom and shortness of breath. He reports PRN xanax use for these panic attacks regularly. Patient is already receiving Ativan 1 mg Q4Hrs PRN for anxiety and do not recommend any additional benzodiazepines at this time.  We attempted to explore his substance use, his UDS was positive for benzodiazepines (unclear if there is prescription drug abuse) and cocaine on admission. He denies any history of substance use, making it difficult to appropriately diagnose a substance use disorder. He is open about his nicotine use, and appears to meet criteria for nicotine use disorder.   On psychiatric assessment he denies any suicidal thoughts, homicidal thoughts, or perceptual disturbances. We will provide outpatient resources for substance use and recommend outpatient psychiatric follow up for his anxiety. We will sign off today.   Diagnoses:  Active Hospital problems: Principal Problem:   Subdural hematoma (HCC) Active Problems:   SDH (subdural hematoma) (HCC)   Malnutrition of moderate degree   Cocaine intoxication delirium (HCC)     Plan   ## Psychiatric Medication Recommendations:  The following medications were started prior to the consultation, and are deemed appropriate to continue: --  Remeron 8 mg at bedtime -- Seroquel 100 mg daily   ## Medical Decision Making Capacity:  -- Capacity was not formally addressed during this encounter; however, the patient appeared to understand and participate in the discussion about their treatment plan.   ## Further Work-up:  -- Per primary -- most recent EKG on 12/06/2022 had QtC of 398 -- Pertinent labwork reviewed earlier this admission includes:  - UDS: cocaine +, benzodiazepines + - Ethanol <10 - A1C 5.1%  ## Disposition:  -- There are no current psychiatric contraindications to discharge at this time  ## Behavioral / Environmental:  --   No specific recommendations at this time.    ## Safety and Observation Level:  - Based on my clinical evaluation, I estimate the patient to be at low risk of self harm in the current setting - At this time, we recommend a routine level of observation. This decision is based on my review of the chart including patient's history and current presentation, interview of the patient, mental status examination, and consideration of suicide risk including evaluating suicidal ideation, plan, intent, suicidal or self-harm behaviors, risk factors, and protective factors. This judgment is based on our ability to directly address suicide risk, implement suicide prevention strategies and develop a safety plan while the patient is in the clinical setting. Please contact our team if there is a concern that risk level has changed.  Suicide risk assessment Patient has following modifiable risk factors for suicide: substance use, which we are addressing by providing outpatient psychiatric services and resources for substance use.   Patient has following non-modifiable or demographic risk  factors for suicide: male gender  Patient has the following protective factors against suicide: Supportive family and Cultural, spiritual, or religious beliefs that discourage suicide   Thank you for this consult request.  Recommendations have been communicated to the primary team.  We will sign off at this time.   Lorri Frederick, MD  Psychiatric and Social History   Relevant Aspects of Hospital Course:  Admitted on 12/06/2022 for subarachnoid hemorrhage following injury hematoma   Patient Report:  Patient seen at bedside this a.m., reports he is doing "very well".  He describes his mood as happy.  He briefly relates the incident prior to his hospitalization, reports he was attempting to clean a pool at a client's house and then slipping and falling.  He reports he has strengthened his relationship with Jesus through this hospitalization, his faith is very important to him.  He reports a history of panic attacks, that only improved with Xanax.  Reports last panic attack was yesterday.  Denying any suicidal ideations.  He denies homicidal ideations.  He denies auditory visual hallucinations.  Denies paranoia or delusions.  Psychiatric ROS Mood Symptoms Denies any symptoms of pervasive sadness or anhedonia. Denies any history of depression, suicidal ideation or self harm.   Manic Symptoms Denies any significant history of expansive energy or mood. Briefly describes a period of feeling "very energetic and crazy" that lasted <24 hours in the setting of starting lexapro.   Anxiety Symptoms Patient has difficulty characterizing his anxiety, is unable to identify any specific stressors. Reports experiencing 1-2 panic attacks on a weekly basis, described as occurring suddenly. Symptoms include a sense of impending doom and shortness of breath. Denies chest pain, dizziness, or excess sweating.   Trauma Symptoms History of trauma was not explored in depth. Denies panic attacks are associated with a history of past-traumas.  Psychosis Symptoms Denies any history of psychosis, including hallucinations, paranoia, or delusions.     Collateral information:  Not obtained for this encounter  Psychiatric  History:  Information collected from patient and chart review  Prev Dx/Sx: patient reports  Current Psych Provider: Denies, is open to following up with an outpatient provider Current Meds: Remeron, Seroquel (started in this admission to wean off precedex) Previous Med Trials: Reports lexapro and atarax, reports unfavorable side effects including agitation and increased energy.  Therapy: Denies; none identified on chart review  Prior ECT: None identified on chart review Prior Psych Hospitalization: Denies; none identified on chart review  Prior Self Harm: Denies; none identified on chart review Prior Violence: Denies ; none identified on chart review  Family Psych History: Reports his mother also has panic attacks Family Hx suicide: none identified on chart review  Social History:  Developmental Hx: Patient is a [place] native, alludes to several failed relationships in his past. Educational Hx: Not assessed Occupational Hx: Pt is currently working odd jobs to make ends meet, reports recently repairing a pool prior to his incident Legal Hx: Denies Living Situation: Lives with mother Spiritual Hx: Ephriam Knuckles; reports strong religious beliefs Access to weapons: Not assessed    Tobacco use: Reports 0.5 ppd, for 28 yrs Alcohol use: Denies Drug use: Patient denies any recent or historical drug use including, hallucinogens, stimulants, opioids   Exam Findings   Psychiatric Specialty Exam:  Presentation  General Appearance: Appropriate for Environment  Eye Contact:Fair  Speech:Clear and Coherent; Normal Rate  Speech Volume:Normal  Handedness:Right   Mood and Affect  Mood:-- ("I'm good")  Affect:Congruent; Appropriate   Thought  Process  Thought Processes:Coherent; Linear (Intermittently disorganized)  Descriptions of Associations:Intact (intermittently circumstancial)  Orientation:Full (Time, Place and Person)  Thought Content:Logical  History of  Schizophrenia/Schizoaffective disorder:No data recorded Duration of Psychotic Symptoms:No data recorded Hallucinations:Hallucinations: None  Ideas of Reference:None  Suicidal Thoughts:Suicidal Thoughts: No  Homicidal Thoughts:Homicidal Thoughts: No   Sensorium  Memory:Immediate Fair; Recent Fair; Remote Fair  Judgment:Fair  Insight:None   Executive Functions  Concentration:Fair  Attention Span:Fair  Recall:Fair  Fund of Knowledge:Fair  Language:Fair   Psychomotor Activity  Psychomotor Activity:Psychomotor Activity: Normal   Assets  Assets:Housing; Desire for Improvement; Social Support   Sleep  Sleep:Sleep: Fair    Physical Exam: Vital signs:  Temp:  [97.8 F (36.6 C)-98.6 F (37 C)] 98.5 F (36.9 C) (07/01 1203) Pulse Rate:  [59-132] 107 (07/01 1407) Resp:  [13-34] 31 (07/01 1407) BP: (92-144)/(53-97) 107/85 (07/01 1349) SpO2:  [90 %-100 %] 99 % (07/01 1407) Weight:  [77.6 kg] 77.6 kg (07/01 0500) Physical Exam Constitutional:      General: He is not in acute distress.    Appearance: He is not ill-appearing.  Pulmonary:     Effort: Pulmonary effort is normal. No respiratory distress.  Neurological:     Mental Status: He is alert and oriented to person, place, and time.  Psychiatric:        Mood and Affect: Mood normal.        Behavior: Behavior normal.        Thought Content: Thought content normal.        Judgment: Judgment normal.    Blood pressure 107/85, pulse (!) 107, temperature 98.5 F (36.9 C), temperature source Oral, resp. rate (!) 31, height 5\' 10"  (1.778 m), weight 77.6 kg, SpO2 99 %. Body mass index is 24.55 kg/m.   Other History   These have been pulled in through the EMR, reviewed, and updated if appropriate.   Family History:  The patient's family history includes Cancer in an other family member.  Medical History: Past Medical History:  Diagnosis Date   Panic attack    Polysubstance abuse (HCC)    Cocaine, BZD    SAH (subarachnoid hemorrhage) (HCC) 09/2022   Traumatic    Surgical History: Past Surgical History:  Procedure Laterality Date   CRANIOTOMY Left 12/06/2022   Procedure: CRANIOTOMY HEMATOMA EVACUATION SUBDURAL;  Surgeon: Bedelia Person, MD;  Location: [facility] OR;  Service: Neurosurgery;  Laterality: Left;    Medications:   Current Facility-Administered Medications:    acetaminophen (TYLENOL) tablet 650 mg, 650 mg, Oral, Q6H PRN, Gaetana Michaelis, MD, 650 mg at 12/15/22 1913   Chlorhexidine Gluconate Cloth 2 % PADS 6 each, 6 each, Topical, Daily, [place], Emily T, MD, 6 each at 12/16/22 0914   clonazePAM (KLONOPIN) tablet 1 mg, 1 mg, Oral, TID, Lorin Glass, MD, 1 mg at 12/16/22 1610   cloNIDine (CATAPRES - Dosed in mg/24 hr) patch 0.2 mg, 0.2 mg, Transdermal, Weekly, Lorin Glass, MD, 0.2 mg at 12/12/22 0857   docusate (COLACE) 50 MG/5ML liquid 100 mg, 100 mg, Oral, BID PRN, Reome, Earle J, RPH   famotidine (PEPCID) tablet 20 mg, 20 mg, Oral, BID, Reome, Earle J, RPH, 20 mg at 12/16/22 9604   feeding supplement (ENSURE ENLIVE / ENSURE PLUS) liquid 237 mL, 237 mL, Oral, TID BM, Lorin Glass, MD, 237 mL at 12/16/22 5409   folic acid (FOLVITE) tablet 1 mg, 1 mg, Oral, Daily, Lorin Glass, MD, 1 mg at 12/16/22 [id number]  haloperidol lactate (HALDOL) injection 5 mg, 5 mg, Intravenous, Q6H PRN, Lorin Glass, MD, 5 mg at 12/16/22 1343   labetalol (NORMODYNE) injection 10 mg, 10 mg, Intravenous, Q10 min PRN, Bedelia Person, MD, 10 mg at 12/16/22 1337   LORazepam (ATIVAN) tablet 1 mg, 1 mg, Oral, Q4H PRN, Cloyd Stagers M, PA-C, 1 mg at 12/16/22 1133   melatonin tablet 3 mg, 3 mg, Oral, QHS PRN, Janeece Riggers, NP   multivitamin with minerals tablet 1 tablet, 1 tablet, Oral, Daily, Reome, Earle J, RPH, 1 tablet at 12/16/22 1610   nicotine (NICODERM CQ - dosed in mg/24 hours) patch 21 mg, 21 mg, Transdermal, Daily, Charlott Holler, MD, 21 mg at 12/16/22 9604   Oral care mouth  rinse, 15 mL, Mouth Rinse, PRN, Bedelia Person, MD   [EXPIRED] PHENObarbital (LUMINAL) injection 97.5 mg, 97.5 mg, Intravenous, Q8H, 97.5 mg at 12/16/22 0000 **FOLLOWED BY** PHENObarbital (LUMINAL) injection 65 mg, 65 mg, Intravenous, Q8H, 65 mg at 12/16/22 0737 **FOLLOWED BY** [START ON 12/18/2022] PHENObarbital (LUMINAL) injection 32.5 mg, 32.5 mg, Intravenous, Q8H, Lorin Glass, MD   polyethylene glycol (MIRALAX / GLYCOLAX) packet 17 g, 17 g, Oral, Daily PRN, Reome, Earle J, RPH   promethazine (PHENERGAN) tablet 12.5-25 mg, 12.5-25 mg, Oral, Q4H PRN, Lorin Glass, MD   QUEtiapine (SEROQUEL) tablet 100 mg, 100 mg, Oral, q morning, Desai, Rahul P, PA-C, 100 mg at 12/16/22 5409   QUEtiapine (SEROQUEL) tablet 200 mg, 200 mg, Oral, QHS, Desai, Rahul P, PA-C, 200 mg at 12/15/22 2139   ramelteon (ROZEREM) tablet 8 mg, 8 mg, Oral, QHS, Lorin Glass, MD, 8 mg at 12/15/22 2139   thiamine (VITAMIN B1) tablet 100 mg, 100 mg, Oral, Daily, 100 mg at 12/16/22 8119 **OR** thiamine (VITAMIN B1) injection 100 mg, 100 mg, Intravenous, Daily, Lorin Glass, MD, 100 mg at 12/12/22 1478  Allergies: Allergies  Allergen Reactions   Zidovudine Other (See Comments)    `unknown

## 2022-12-16 NOTE — Progress Notes (Signed)
Physical Therapy Treatment Patient Details Name: Joshua Bishop MRN: [id number] DOB: April 28, 1981 Today's Date: 12/16/2022   History of Present Illness 42 yo male admitted 12/06/22 due to AMS, found to have large L subacute SDH, now s/p L crani on 6/21 for evacuation of SDH.  PMH anxiety, panic attacks, 04/24 SAH.    PT Comments  Patient progressing well with balance, gait and cognition.  Still some deficits as demonstrated by Dynamic Gait Index score 18/24 with some issues with head turns and some mild issues with speed changes, etc.  Cognitive recovery progressing with behaviors consistent with Ranchos level VI with orientation and memory skills and participation in basic ADL's.  PT will continue to follow.  Likely can go home with supervision and follow up outpatient PT.     Assistance Recommended at Discharge Intermittent Supervision/Assistance  If plan is discharge home, recommend the following:  Can travel by private vehicle    Direct supervision/assist for medications management;Assist for transportation;Assistance with cooking/housework;Direct supervision/assist for financial management      Equipment Recommendations  None recommended by PT    Recommendations for Other Services       Precautions / Restrictions Precautions Precautions: Fall Precaution Comments: posey belt, CIWA     Mobility  Bed Mobility Overal bed mobility: Needs Assistance Bed Mobility: Sit to Supine           General bed mobility comments: up OOB with OT upon entry    Transfers Overall transfer level: Needs assistance Equipment used: None Transfers: Sit to/from Stand Sit to Stand: Min guard           General transfer comment: assist for lines and safety    Ambulation/Gait Ambulation/Gait assistance: Min assist, Min guard Gait Distance (Feet): 300 Feet Assistive device: None Gait Pattern/deviations: Step-through pattern, Drifts right/left, Decreased stride length       General  Gait Details: mild veering esp with head turns, see DGI   Stairs Stairs: Yes Stairs assistance: Min guard Stair Management: One rail Right, Alternating pattern, Forwards Number of Stairs: 10 General stair comments: assist for safety and cues to use rail   Wheelchair Mobility     Tilt Bed    Modified Rankin (Stroke Patients Only)       Balance Overall balance assessment: Needs assistance   Sitting balance-Leahy Scale: Good       Standing balance-Leahy Scale: Good                   Standardized Balance Assessment Standardized Balance Assessment : Dynamic Gait Index   Dynamic Gait Index Level Surface: Mild Impairment Change in Gait Speed: Mild Impairment Gait with Horizontal Head Turns: Mild Impairment Gait with Vertical Head Turns: Mild Impairment Gait and Pivot Turn: Mild Impairment Step Over Obstacle: Normal Step Around Obstacles: Normal Steps: Mild Impairment Total Score: 18      Cognition Arousal/Alertness: Awake/alert Behavior During Therapy: Impulsive Overall Cognitive Status: Impaired/Different from baseline Area of Impairment: Safety/judgement, Awareness               Rancho Levels of Cognitive Functioning Rancho [place] Amigos Scales of Cognitive Functioning: Confused, Appropriate         Safety/Judgement: Decreased awareness of deficits, Decreased awareness of safety Awareness: Emergent       Rancho [employer] Scales of Cognitive Functioning: Confused, Appropriate    Exercises      General Comments General comments (skin integrity, edema, etc.): HR sustained in 160's with ambulation, denies he is having a  panic attack and noted no SOB or pt reporting feeling his heart racing,      Pertinent Vitals/Pain Pain Assessment Pain Assessment: No/denies pain    Home Living                          Prior Function            PT Goals (current goals can now be found in the care plan section) Progress towards PT goals:  Progressing toward goals    Frequency    Min 4X/week      PT Plan Discharge plan needs to be updated    Co-evaluation PT/OT/SLP Co-Evaluation/Treatment: Yes   PT goals addressed during session: Mobility/safety with mobility;Balance;Strengthening/ROM        AM-PAC PT "6 Clicks" Mobility   Outcome Measure  Help needed turning from your back to your side while in a flat bed without using bedrails?: None Help needed moving from lying on your back to sitting on the side of a flat bed without using bedrails?: None Help needed moving to and from a bed to a chair (including a wheelchair)?: A Little Help needed standing up from a chair using your arms (e.g., wheelchair or bedside chair)?: None Help needed to walk in hospital room?: A Little Help needed climbing 3-5 steps with a railing? : A Little 6 Click Score: 21    End of Session Equipment Utilized During Treatment: Gait belt Activity Tolerance: Patient tolerated treatment well Patient left: in chair;with chair alarm set;with restraints reapplied   PT Visit Diagnosis: Other abnormalities of gait and mobility (R26.89);Muscle weakness (generalized) (M62.81);Difficulty in walking, not elsewhere classified (R26.2);Other symptoms and signs involving the nervous system (R29.898)     Time: 4098-1191 PT Time Calculation (min) (ACUTE ONLY): 30 min  Charges:    $Gait Training: 8-22 mins PT General Charges $$ ACUTE PT VISIT: 1 Visit                     Sheran Lawless, PT Acute Rehabilitation Services Office:[phone] 12/16/2022    Joshua Bishop 12/16/2022, 5:30 PM

## 2022-12-17 DIAGNOSIS — S065XAA Traumatic subdural hemorrhage with loss of consciousness status unknown, initial encounter: Secondary | ICD-10-CM | POA: Diagnosis not present

## 2022-12-17 MED ORDER — PHENOBARBITAL 32.4 MG PO TABS
32.4000 mg | ORAL_TABLET | Freq: Three times a day (TID) | ORAL | Status: AC
  Administered 2022-12-17 – 2022-12-18 (×2): 32.4 mg via ORAL
  Filled 2022-12-17 (×2): qty 1

## 2022-12-17 MED ORDER — FOLIC ACID 1 MG PO TABS
1.0000 mg | ORAL_TABLET | Freq: Every day | ORAL | Status: DC
  Administered 2022-12-18: 1 mg via ORAL
  Filled 2022-12-17: qty 1

## 2022-12-17 MED ORDER — THIAMINE MONONITRATE 100 MG PO TABS
100.0000 mg | ORAL_TABLET | Freq: Every day | ORAL | Status: DC
  Administered 2022-12-18: 100 mg via ORAL
  Filled 2022-12-17: qty 1

## 2022-12-17 MED ORDER — PHENOBARBITAL 64.8 MG PO TABS
64.8000 mg | ORAL_TABLET | Freq: Three times a day (TID) | ORAL | Status: DC
  Administered 2022-12-17: 64.8 mg via ORAL
  Filled 2022-12-17: qty 2

## 2022-12-17 MED ORDER — DOCUSATE SODIUM 100 MG PO CAPS: 100.0000 mg | ORAL_CAPSULE | Freq: Two times a day (BID) | ORAL | Status: DC | PRN

## 2022-12-17 MED ORDER — CLONAZEPAM 1 MG PO TABS
2.0000 mg | ORAL_TABLET | Freq: Three times a day (TID) | ORAL | Status: DC
  Administered 2022-12-17 – 2022-12-18 (×2): 2 mg via ORAL
  Filled 2022-12-17 (×2): qty 2

## 2022-12-17 MED ORDER — MELATONIN 5 MG PO TABS
5.0000 mg | ORAL_TABLET | Freq: Every day | ORAL | Status: DC
  Administered 2022-12-17: 5 mg via ORAL
  Filled 2022-12-17: qty 1

## 2022-12-17 MED ORDER — PHENOBARBITAL 32.4 MG PO TABS: 32.4000 mg | ORAL_TABLET | Freq: Three times a day (TID) | ORAL | Status: DC

## 2022-12-17 MED ORDER — HALOPERIDOL LACTATE 5 MG/ML IJ SOLN: 5.0000 mg | Freq: Four times a day (QID) | INTRAMUSCULAR | Status: DC | PRN

## 2022-12-17 NOTE — Progress Notes (Signed)
Subjective: Pt agitated overnight per EMR review. Upon exam, he is alert and calm/cooperative. He is motivated to dc to home.   Objective: Vital signs in last 24 hours: Temp:  [97.6 F (36.4 C)-98.8 F (37.1 C)] 98.5 F (36.9 C) (07/02 0816) Pulse Rate:  [80-127] 88 (07/02 0816) Resp:  [18-34] 19 (07/02 0816) BP: (106-138)/(70-94) 122/83 (07/02 0816) SpO2:  [94 %-100 %] 100 % (07/02 0816)  Intake/Output from previous day: 07/01 0701 - 07/02 0700 In: 1561.1 [P.O.:1560; I.V.:1.1] Out: 1220 [Urine:1220] Intake/Output this shift: No intake/output data recorded.  Awake, alert, oriented Speech fluent, appropriate CN grossly intact 5/5 BUE/BLE Wound c/d/i   Assessment/Plan:  42 yo M s/p L crani for large subacute SDH on 12/06/22 with post op neurologic recovery complicated by acute substance withdrawal and agitation and potential underlying Huntington's dx.    -Cont supportive care -Call w/ questions/concerns -Staple removal ~7/5  Deadrian Toya CAYLIN Kriss Ishler 12/17/2022, 11:55 AM

## 2022-12-17 NOTE — TOC Progression Note (Signed)
Transition of Care [facility]) - Progression Note    Patient Details  Name: Joshua Bishop MRN: [id number] Date of Birth: 1980-12-01  [facility] of [facility] CM/SW Contact  Baldemar Lenis, [place] Phone Number: 12/17/2022, 3:08 PM  Clinical Narrative:   CSW notified by Rehab Admissions that patient will need SNF. CSW attempted to reach mother to discuss, left a voicemail. Awaiting call back.    Expected Discharge Plan: IP Rehab Facility Barriers to Discharge: Continued Medical Work up  Expected Discharge Plan and Services In-house Referral: Clinical Social Work     Living arrangements for the past 2 months: Single Family Home                                       Social Determinants of Health (SDOH) Interventions SDOH Screenings   Tobacco Use: High Risk (12/11/2022)    Readmission Risk Interventions     No data to display

## 2022-12-17 NOTE — Progress Notes (Signed)
Physical Therapy Treatment Patient Details Name: Joshua Bishop MRN: [id number] DOB: [birth year] Today's Date: 12/17/2022   History of Present Illness 42 yo male admitted 12/06/22 due to AMS, found to have large L subacute SDH, now s/p L crani on 6/21 for evacuation of SDH.  PMH anxiety, panic attacks, 04/24 SAH.    PT Comments  Physically doing quite well. Low fall risk based on BERG and DGI with DGI score improved to 21/24 points today. Patient remains mainly limited by cognition. Answers questions appropriately but is tangential and easily distracted. Tolerated higher level dynamic challenges. Mobilizing at a mod I level but needs supervision for direction and unit orientation. Shows decreased insight and awareness of mild deficits that were noted by PT today. Will continue to follow acutely.       Assistance Recommended at Discharge Intermittent Supervision/Assistance  If plan is discharge home, recommend the following:  Can travel by private vehicle    Direct supervision/assist for medications management;Assist for transportation;Assistance with cooking/housework;Direct supervision/assist for financial management      Equipment Recommendations  None recommended by PT    Recommendations for Other Services       Precautions / Restrictions Precautions Precautions: Fall Precaution Comments: CIWA Restrictions Weight Bearing Restrictions: No     Mobility  Bed Mobility               General bed mobility comments: Standing in doorway when PT arrived    Transfers Overall transfer level: Modified independent Equipment used: None Transfers: Sit to/from Stand             General transfer comment: Performed arms across chest x 5 from low bed setting.    Ambulation/Gait Ambulation/Gait assistance: Supervision Gait Distance (Feet): 225 Feet Assistive device: None Gait Pattern/deviations: Step-through pattern, Drifts right/left   Gait velocity interpretation:  >2.62 ft/sec, indicative of community ambulatory   General Gait Details: Mild drift noted with dynamic challenges. Able to self correct. Supervision for directions and orientation on unit.   Stairs             Wheelchair Mobility     Tilt Bed    Modified Rankin (Stroke Patients Only) Modified Rankin (Stroke Patients Only) Pre-Morbid Rankin Score: No symptoms Modified Rankin: Moderate disability     Balance Overall balance assessment: Needs assistance Sitting-balance support: Feet supported Sitting balance-Leahy Scale: Normal     Standing balance support: No upper extremity supported Standing balance-Leahy Scale: Good                   Standardized Balance Assessment Standardized Balance Assessment : Dynamic Gait Index, Berg Balance Test Berg Balance Test Sit to Stand: Able to stand without using hands and stabilize independently Standing Unsupported: Able to stand safely 2 minutes Sitting with Back Unsupported but Feet Supported on Floor or Stool: Able to sit safely and securely 2 minutes Stand to Sit: Sits safely with minimal use of hands Transfers: Able to transfer safely, minor use of hands Standing Unsupported with Eyes Closed: Able to stand 10 seconds safely Standing Ubsupported with Feet Together: Able to place feet together independently and stand 1 minute safely From Standing, Reach Forward with Outstretched Arm: Can reach confidently >25 cm (10") From Standing Position, Pick up Object from Floor: Able to pick up shoe, needs supervision From Standing Position, Turn to Look Behind Over each Shoulder: Looks behind from both sides and weight shifts well Turn 360 Degrees: Able to turn 360 degrees safely in 4 seconds or  less Standing Unsupported, Alternately Place Feet on Step/Stool: Able to stand independently and safely and complete 8 steps in 20 seconds Standing Unsupported, One Foot in Front: Able to place foot tandem independently and hold 30  seconds Standing on One Leg: Able to lift leg independently and hold 5-10 seconds Total Score: 54 Dynamic Gait Index Level Surface: Normal Change in Gait Speed: Normal Gait with Horizontal Head Turns: Mild Impairment Gait with Vertical Head Turns: Normal Gait and Pivot Turn: Mild Impairment Step Over Obstacle: Normal Step Around Obstacles: Normal Steps: Mild Impairment Total Score: 21      Cognition Arousal/Alertness: Awake/alert Behavior During Therapy: Impulsive Overall Cognitive Status: Impaired/Different from baseline Area of Impairment: Safety/judgement, Following commands               Rancho Levels of Cognitive Functioning Rancho Los Amigos Scales of Cognitive Functioning: Confused, Appropriate       Following Commands: Follows one step commands consistently, Follows multi-step commands inconsistently Safety/Judgement: Decreased awareness of deficits, Decreased awareness of safety   Problem Solving: Requires verbal cues General Comments: Tangential at times.   Rancho Mirant Scales of Cognitive Functioning: Confused, Appropriate    Exercises      General Comments        Pertinent Vitals/Pain Pain Assessment Pain Assessment: No/denies pain    Home Living                          Prior Function            PT Goals (current goals can now be found in the care plan section) Acute Rehab PT Goals Patient Stated Goal: to return to independent PT Goal Formulation: With patient/family Time For Goal Achievement: 12/24/22 Potential to Achieve Goals: Good Progress towards PT goals: Progressing toward goals    Frequency    Min 4X/week      PT Plan Current plan remains appropriate    Co-evaluation              AM-PAC PT "6 Clicks" Mobility   Outcome Measure  Help needed turning from your back to your side while in a flat bed without using bedrails?: None Help needed moving from lying on your back to sitting on the side of a  flat bed without using bedrails?: None Help needed moving to and from a bed to a chair (including a wheelchair)?: None Help needed standing up from a chair using your arms (e.g., wheelchair or bedside chair)?: None Help needed to walk in hospital room?: None Help needed climbing 3-5 steps with a railing? : None 6 Click Score: 24    End of Session   Activity Tolerance: Patient tolerated treatment well Patient left: Other (comment) (Refuses to sit, goes into restroom at end of session; RN notified.) Nurse Communication: Mobility status PT Visit Diagnosis: Other abnormalities of gait and mobility (R26.89);Muscle weakness (generalized) (M62.81);Difficulty in walking, not elsewhere classified (R26.2);Other symptoms and signs involving the nervous system (R29.898)     Time: 7829-5621 PT Time Calculation (min) (ACUTE ONLY): 11 min  Charges:    $Therapeutic Activity: 8-22 mins PT General Charges $$ ACUTE PT VISIT: 1 Visit                     Kathlyn Sacramento, PT, DPT [facility]  Rehabilitation Services Physical Therapist Office: [phone] Website: [email or web address]    Berton Mount 12/17/2022, 4:57 PM

## 2022-12-17 NOTE — Progress Notes (Signed)
Inpatient Rehab Admissions Coordinator:   I spoke with pt.'s mother regarding PT's recommendation for outpatient and she states that family is no longer willing/able to take him home and provide 24/7 support at d/c. This also means that CIR cannot accept him, as he does not have a safe disposition plan. Pt.'s mother would like him placed in SNF for rehab if possible. CIR will sign off. I have notified TOC.  Megan Salon, MS, CCC-SLP Rehab Admissions Coordinator  [phone] (celll) [phone] (office)

## 2022-12-17 NOTE — Progress Notes (Signed)
[facility] Progress Note Patient: Joshua Bishop Malcolm ZOX:[id number] DOB: 08/31/1980 DOA: 12/06/2022  DOS: the patient was seen and examined on 12/17/2022  Brief hospital course: 42 year old male who presented to [facility] ED 6/21 for AMS x 2-3 days. PMHx significant for anxiety/panic attacks, recent admission for traumatic [facility] 09/2022. Family history of Huntington's chorea (father deceased from this illness). CT Head initially unable to be completed because of restlessness and agitation, he was given Valium 10 mg without much help, eventually intubated for airway protection/ CT Head showed large frontoparietal left-sided subacute subdural hematoma with severe cerebral edema and left-sided uncal herniation.  NSGY was consulted and patient was taken to OR for crani/evacuation of subdural hematoma [name]).   PCCM was consulted for evaluation/medical management and admission to ICU.  6/21 - Presented to [facility] ED for AMS. CT Head with large L frontoparietal convexity SDH up to 20mm in coronal plane, 18mm L to R shift, L uncal herniation/torsion of brainstem and entrapment of lateral ventricles. CT C-spine without acute fracture. Taken to OR for crani/hematoma evacuation [name]). 6/22 - Repeat CT Head (postop) with evacuated SDH, improved midline shift (reduced to 6mm). 6/23 - Extubated to 3LNC. 6/24 - Agitated requiring increased sedation, CIWA started. 6/25 - Persistent agitated, severe multifactorial encephalopathy; clonidine and phenobarb started to attempt to wean from Precedex. 6/27 extreme agitation despite above measures. Started on ketamine 6/28-29 ketamine wean, adjustments to PO meds  7/1- off precedex gtt at 0630, remains stable and safe to transfer out of ICU  Assessment and Plan: Left large subacute subdural hematoma Cerebral edema with brain compression, midline shift of 18 mm - s/p mannitol and 3% NS Left-sided uncal herniation S/p left hemicraniectomy for SDH evacuation and bur hole  placement 6/21 Brought in by mother due to confusion, aphasia ongoing for 3 days after returning from work in [employer]. Taking Advil for his headache. Progressively worsened in ER with agitation and confusion requiring intubation. Workup showed that the patient had SDH. Underwent emergent craniotomy for evacuation on 6/21. Per neurosurgery stable tomorrow on 7/5.  Acute encephalopathy due to subdural hematoma + ICU delirium  GAD with panic attacks. Ongoing issue.  Patient threatening to leave AMA. Currently medically stable but does not have a safe discharge plan. -Continue clonidine patch, BID Seroquel and klonopin, gabapentin -restraints off since 6/29, precedex off since 7/1 at 0630 -Defer further imaging unless new worrisome focal/neurological findings appear -Nightly scheduled melatonin added to assist in day/night schedules   Psychiatry was consulted.  Suspected the patient is suffering from GAD with panic attacks.  Psychiatry has currently signed off.  Psychiatry.  The patient appears to understand and participate in the discussion about the treatment plan well.   Polysubstance abuse Hx Panic Attacks -Encourage cessation from polysubstance abuse in order to mitigate associated risk factors -Seroquel, klonopin, and clonidine patch BID   Medical Decision Making Capacity:  The patient is able to tell me where he is, able to answer why he is in the hospital, but likely what he has gone through in the hospital stay and able to tell me what will be his course of action in an event he has recurrence of signs or symptoms of headache. Patient is contracting for safety and contracting to take his medication. At present I feel that the patient has capacity to make medical decisions.  Mother is concerned about patient's desire to leave the hospital.  Deconditioning. Patient able to ambulate in the hallway with physical therapy assistance. Walked to 225 feet.  6 clicks score 24. Per  CIR, not a CIR candidate. Do not think patient actually requires SNF.   Subjective: No nausea or vomiting.  No headache.  Physical Exam: General: in Mild distress, No Rash Cardiovascular: S1 and S2 Present, No Murmur Respiratory: Good respiratory effort, Bilateral Air entry present. No Crackles, No wheezes Abdomen: Bowel Sound present, No tenderness Extremities: No edema Neuro: Alert, anxious and oriented x3, no new focal deficit  Data Reviewed: I have Reviewed nursing notes, Vitals, and Lab results. CBG reviewed.  Disposition: Status is: Inpatient Remains inpatient appropriate because: Needing further workup  heparin injection 5,000 Units Start: 12/16/22 2200 SCDs Start: 12/06/22 2034   Family Communication: Mother at bedside. Level of care: Telemetry Surgical   Vitals:   12/17/22 0600 12/17/22 0816 12/17/22 1232 12/17/22 1651  BP: 132/85 122/83 132/84 122/77  Pulse: 80 88 (!) 107 87  Resp: 18 19 18 19   Temp: 98.7 F (37.1 C) 98.5 F (36.9 C) 98.5 F (36.9 C) 98.6 F (37 C)  TempSrc: Oral Oral Axillary Oral  SpO2: 100% 100% 100% 95%  Weight:      Height:         Author: Lynden Oxford, MD 12/17/2022 6:43 PM  Please look on [email or web address] to find out who is on call.

## 2022-12-17 NOTE — Hospital Course (Signed)
Brief hospital course: 42 year old male who presented to [facility] ED 6/21 for AMS x 2-3 days. PMHx significant for anxiety/panic attacks, recent admission for traumatic [facility] 09/2022. Family history of Huntington's chorea (father deceased from this illness). CT Head initially unable to be completed because of restlessness and agitation, he was given Valium 10 mg without much help, eventually intubated for airway protection/ CT Head showed large frontoparietal left-sided subacute subdural hematoma with severe cerebral edema and left-sided uncal herniation.  NSGY was consulted and patient was taken to OR for crani/evacuation of subdural hematoma Joshua Bishop).   PCCM was consulted for evaluation/medical management and admission to ICU.  6/21 - Presented to [facility] ED for AMS. CT Head with large L frontoparietal convexity SDH up to 20mm in coronal plane, 18mm L to R shift, L uncal herniation/torsion of brainstem and entrapment of lateral ventricles. CT C-spine without acute fracture. Taken to OR for crani/hematoma evacuation Joshua Bishop). 6/22 - Repeat CT Head (postop) with evacuated SDH, improved midline shift (reduced to 6mm). 6/23 - Extubated to 3LNC. 6/24 - Agitated requiring increased sedation, CIWA started. 6/25 - Persistent agitated, severe multifactorial encephalopathy; clonidine and phenobarb started to attempt to wean from Precedex. 6/27 extreme agitation despite above measures. Started on ketamine 6/28-29 ketamine wean, adjustments to PO meds  7/1- off precedex gtt at 0630, remains stable and safe to transfer out of ICU  Assessment and Plan: Left large subacute subdural hematoma Cerebral edema with brain compression, midline shift of 18 mm - s/p mannitol and 3% NS Left-sided uncal herniation S/p left hemicraniectomy for SDH evacuation and bur hole placement 6/21 Brought in by mother due to confusion, aphasia ongoing for 3 days after returning from work in [employer]. Taking Advil for his  headache. Progressively worsened in ER with agitation and confusion requiring intubation. Workup showed that the patient had SDH. Underwent emergent craniotomy for evacuation on 6/21. Per neurosurgery stable tomorrow on 7/5.  Acute encephalopathy due to subdural hematoma + ICU delirium  GAD with panic attacks. Ongoing issue.  Patient threatening to leave AMA. Currently medically stable but does not have a safe discharge plan. -Continue clonidine patch, BID Seroquel and klonopin, gabapentin -restraints off since 6/29, precedex off since 7/1 at 0630 -Defer further imaging unless new worrisome focal/neurological findings appear -Nightly scheduled melatonin added to assist in day/night schedules   Psychiatry was consulted.  Suspected the patient is suffering from GAD with panic attacks.  Psychiatry has currently signed off.  Psychiatry.  The patient appears to understand and participate in the discussion about the treatment plan well.   Polysubstance abuse Hx Panic Attacks -Encourage cessation from polysubstance abuse in order to mitigate associated risk factors -Seroquel, klonopin, and clonidine patch BID   Medical Decision Making Capacity:  The patient is able to tell me where he is, able to answer why he is in the hospital, but likely what he has gone through in the hospital stay and able to tell me what will be his course of action in an event he has recurrence of signs or symptoms of headache. Patient is contracting for safety and contracting to take his medication. At present I feel that the patient has capacity to make medical decisions.  Mother is concerned about patient's desire to leave the hospital.  Deconditioning. Patient able to ambulate in the hallway with physical therapy assistance. Walked to 225 feet.  6 clicks score 24. Per CIR, not a CIR candidate. Do not think patient actually requires SNF.

## 2022-12-18 DIAGNOSIS — F14921 Cocaine use, unspecified with intoxication delirium: Secondary | ICD-10-CM | POA: Diagnosis not present

## 2022-12-18 DIAGNOSIS — E440 Moderate protein-calorie malnutrition: Secondary | ICD-10-CM

## 2022-12-18 DIAGNOSIS — S065XAA Traumatic subdural hemorrhage with loss of consciousness status unknown, initial encounter: Secondary | ICD-10-CM | POA: Diagnosis not present

## 2022-12-18 MED ORDER — PHENOBARBITAL 32.4 MG PO TABS: 32.4000 mg | ORAL_TABLET | Freq: Three times a day (TID) | ORAL | Status: DC

## 2022-12-18 NOTE — Progress Notes (Signed)
Subjective: NAEs o/n.  Objective: Vital signs in last 24 hours: Temp:  [97.7 F (36.5 C)-98.6 F (37 C)] 97.7 F (36.5 C) (07/03 0410) Pulse Rate:  [79-107] 79 (07/03 0410) Resp:  [18-19] 18 (07/03 0410) BP: (90-132)/(54-84) 90/54 (07/03 0410) SpO2:  [95 %-100 %] 100 % (07/03 0410) Weight:  [70.9 kg] 70.9 kg (07/03 0500)  Intake/Output from previous day: 07/02 0701 - 07/03 0700 In: 600 [P.O.:600] Out: 300 [Urine:300] Intake/Output this shift: No intake/output data recorded.  Awake, alert, oriented Speech fluent, appropriate CN grossly intact 5/5 BUE/BLE Wound c/d/i  Studies/Results: No results found.  Assessment/Plan: 42 yo M s/p L crani for large subacute SDH on 12/06/22 with post op neurologic recovery complicated by acute substance withdrawal and agitation and potential underlying Huntington's dx.    -Cont supportive care -Call w/ questions/concerns -Staples removed today in anticipation of dc. No complications.    Grettell Ransdell CAYLIN Escher Harr 12/18/2022, 8:53 AM

## 2022-12-18 NOTE — Progress Notes (Signed)
Patient has continually tried to leave the unit.  States " I want to leave, you can't hold me here."  He has called his Mom and girlfriend wanting to leave.  I have notified Dr. Alanda Slim multiple times requesting that he come speak with the patient. He states "I have not seen a doctor and he keeps lying to me."  I have notified the charge RN, Wynne Dust and the patients mother.  Patient has signed the AMA form and refuses to wait and speak with the MD.

## 2022-12-18 NOTE — Discharge Summary (Signed)
Physician Discharge Summary  Joshua Bishop WGN:[id number] DOB: 09-26-1980 DOA: 12/06/2022  PCP: Patient, No Pcp Per  Admit date: 12/06/2022 Discharge date: 12/18/2022 Admitted From: Home. Disposition: Left AGAINST MEDICAL ADVICE Recommendations for Outpatient Follow-up:  Follow up with PCP in 1 week Consider referral to psychiatry outpatient for anxiety and panic attacks Check CMP and CBC at follow-up Please follow up on the following pending results: None  Home Health: None Equipment/Devices: None  Discharge Condition: Stable CODE STATUS: Full code   Hospital course 42 year old M with PMH of anxiety and panic attack and recent hospitalization for [facility] in 09/2022 presented to ED on 6/21 with altered mental status for 2 to 3 days. CT Head initially unable to be completed because of restlessness and agitation, he was given Valium 10 mg without much help, eventually intubated for airway protection. CT Head showed large frontoparietal left-sided subacute subdural hematoma with severe cerebral edema and left-sided uncal herniation.  Neurosurgery was consulted and patient was taken to OR for crani/evacuation of subdural hematoma [name]), and patient was admitted to ICU.  Repeat CT head on 6/22 with evacuated SDH and improved midline shift to 6 mm.  Patient was extubated to 3 L by nasal cannula on 6/23.  However, he had agitation requiring increased sedation from 6/24 to 7/1.  Eventually, he came off Precedex drip and remained stable.  He was transferred out of ICU on 7/2.    Patient was evaluated by psychiatry on 7/1 and diagnosed with GAD with panic attacks, nicotine use disorder.  He had no SI, HI or perceptual disturbance.  He demonstrated some understanding of participation in his treatment plan.  Patient threatened to leave AMA on 7/2 but did not follow through.  Deemed to have capacity.  Patient threatened to leave AMA again on 7/3 about 10 AM in the morning stating that "I missed my  40-year-old daughter".  He called his significant other and asked her to pick him up.  He was very upset when she refused to come and pick him up.  Patient is oriented x 4.  He knows why he is in the hospital but insisted on going home.  Eventually, OT came in and talk to him and walked him in the hallway.  After ambulating with OT and listening to some music, patient agreed to stay.  Psychiatry consulted again.    About 1 PM in the afternoon, patient changed his mind again and decided to leave AMA.  Patient has capacity, and cannot be committed involuntarily.   See individual problem list below for more.   Problems addressed during this hospitalization Principal Problem:   Subdural hematoma (HCC) Active Problems:   SDH (subdural hematoma) (HCC)   Malnutrition of moderate degree   Cocaine intoxication delirium (HCC)   Left large subacute subdural hematoma Cerebral edema with brain compression, midline shift of 18 mm - s/p mannitol and 3% NS Left-sided uncal herniation S/p left hemicraniectomy for SDH evacuation and bur hole placement 6/21 Brought in by mother due to confusion, aphasia ongoing for 3 days after returning from work in [employer]. Taking Advil for his headache. Progressively worsened in ER with agitation and confusion requiring intubation. Workup showed that the patient had SDH. Underwent emergent craniotomy for evacuation on 6/21. Per neurosurgery-stable for discharge and outpatient follow-up.   Acute encephalopathy due to subdural hematoma + ICU delirium  GAD with panic attacks. Delirium/agitation: Currently medically stable but does not have a safe discharge plan. -Started on clonidine patch, BID Seroquel  and klonopin, gabapentin -Evaluated by psychiatry.   Polysubstance abuse: Cocaine and EtOH. -Encourage cessation from polysubstance abuse in order to mitigate associated risk factors -Seroquel, klonopin, and clonidine patch BID     Deconditioning: Improved.   Initially, therapy recommended CIR but felt to be not a candidate.  Done therapy recommended SNF.  However, mobility improved.  Moderate malnutrition Nutrition Problem: Moderate Malnutrition Etiology: social / environmental circumstances (substance abuse) Signs/Symptoms: moderate muscle depletion, moderate fat depletion Interventions: Ensure Enlive (each supplement provides 350kcal and 20 grams of protein)     Time spent 35 minutes  Vital signs Vitals:   12/18/22 0410 12/18/22 0500 12/18/22 0936 12/18/22 1236  BP: (!) 90/54  (!) 152/85 121/75  Pulse: 79  (!) 109 (!) 110  Temp: 97.7 F (36.5 C)  97.6 F (36.4 C) 97.8 F (36.6 C)  Resp: 18  18 18   Height:      Weight:  70.9 kg    SpO2: 100%  99% 100%  TempSrc: Oral  Oral Oral  BMI (Calculated):  22.43       Discharge exam  GENERAL: No apparent distress.  Nontoxic. HEENT: MMM.  Vision and hearing grossly intact.  NECK: Supple.  No apparent JVD.  RESP:  No IWOB.  Fair aeration bilaterally. CVS:  RRR. Heart sounds normal.  ABD/GI/GU: BS+. Abd soft, NTND.  MSK/EXT:  Moves extremities. No apparent deformity. No edema.  SKIN: no apparent skin lesion or wound NEURO: Awake and alert. Oriented x 4.  No apparent focal neuro deficit.  Discharge Instructions  Allergies as of 12/18/2022       Reactions   Zidovudine Other (See Comments)   `unknown      Med Rec must be completed prior to using this [facility]       Consultations: Pulmonology Neurosurgery Psychiatry  Procedures/Studies: 6/21-craniotomy with subdural hematoma evacuation on 6/21.   DG Abd Portable 1V  Result Date: 12/13/2022 CLINICAL DATA:  Feeding tube placement EXAM: PORTABLE ABDOMEN - 1 VIEW limited for tube placement COMPARISON:  X-ray 12/06/2022 FINDINGS: Dobbhoff tube with tip extending to the right of the spine extending inferiorly, likely along the proximal duodenum. Elsewhere in the visualized upper abdomen there is some air along nondilated  loops of bowel IMPRESSION: Dobbhoff tube with tip overlying the proximal duodenum. Electronically Signed   By: Karen Kays M.D.   On: 12/13/2022 10:27   CT HEAD WO CONTRAST  Result Date: 12/07/2022 CLINICAL DATA:  Postop craniotomy EXAM: CT HEAD WITHOUT CONTRAST TECHNIQUE: Contiguous axial images were obtained from the base of the skull through the vertex without intravenous contrast. RADIATION DOSE REDUCTION: This exam was performed according to the departmental dose-optimization program which includes automated exposure control, adjustment of the mA and/or kV according to patient size and/or use of iterative reconstruction technique. COMPARISON:  12/06/2022 FINDINGS: Brain: Evacuation of subdural hematoma with space primarily filled with gas. Midline shift is significantly improved, now measuring 6 mm. No evidence of complicating infarct or hydrocephalus. Vascular: No hyperdense vessel or unexpected calcification. Skull: Unremarkable craniotomy site with drain. Sinuses/Orbits: Negative IMPRESSION: Evacuated subdural hematoma with improved midline shift now measuring 6 mm. The residual space contains gas with no recurrent hemorrhage. Electronically Signed   By: Tiburcio Pea M.D.   On: 12/07/2022 09:29   CT Cervical Spine Wo Contrast  Result Date: 12/06/2022 CLINICAL DATA:  Neck trauma, intracranial hemorrhage EXAM: CT CERVICAL SPINE WITHOUT CONTRAST TECHNIQUE: Multidetector CT imaging of the cervical spine was performed without intravenous contrast. Multiplanar  CT image reconstructions were also generated. RADIATION DOSE REDUCTION: This exam was performed according to the departmental dose-optimization program which includes automated exposure control, adjustment of the mA and/or kV according to patient size and/or use of iterative reconstruction technique. COMPARISON:  09/19/2022 FINDINGS: Alignment: Alignment is grossly anatomic. Skull base and vertebrae: No acute fracture. No primary bone lesion or  focal pathologic process. Soft tissues and spinal canal: No prevertebral fluid or swelling. No visible canal hematoma. Disc levels:  Mild spondylosis at C5-6 and C6-7 unchanged. Upper chest: Endotracheal tube and enteric catheter are identified. Lung apices are clear. Other: Reconstructed images demonstrate no additional findings. Please see preceding head CT describing left-sided subdural hematoma and mass effect. IMPRESSION: 1. No acute cervical spine fracture. 2. Please see preceding head CT report describing large left subdural hematoma and associated mass effect. Electronically Signed   By: Sharlet Salina M.D.   On: 12/06/2022 17:43   DG Abdomen 1 View  Result Date: 12/06/2022 CLINICAL DATA:  Enteric catheter placement EXAM: ABDOMEN - 1 VIEW COMPARISON:  None Available. FINDINGS: Frontal view of the lower chest and upper abdomen demonstrates enteric catheter passing below diaphragm tip and side port projecting over the gastric fundus. Bowel gas pattern is unremarkable. Endotracheal tube tip projects over the tracheal air column at the thoracic inlet. Lungs are clear. IMPRESSION: 1. Enteric catheter tip projecting over gastric fundus. Electronically Signed   By: Sharlet Salina M.D.   On: 12/06/2022 17:20   DG Chest Portable 1 View  Result Date: 12/06/2022 CLINICAL DATA:  Altered level of consciousness EXAM: PORTABLE CHEST 1 VIEW COMPARISON:  02/07/2019 FINDINGS: Single frontal view of the chest demonstrates endotracheal tube overlying tracheal air column tip at thoracic inlet. Enteric catheter passes below diaphragm tip excluded by collimation. Cardiac silhouette is unremarkable. No acute airspace disease, effusion, or pneumothorax. No acute bony abnormalities. IMPRESSION: 1. No complication after intubation.  No acute process. Electronically Signed   By: Sharlet Salina M.D.   On: 12/06/2022 17:20   CT Head Wo Contrast  Result Date: 12/06/2022 CLINICAL DATA:  Mental status change, unknown cause.  EXAM: CT HEAD WITHOUT CONTRAST TECHNIQUE: Contiguous axial images were obtained from the base of the skull through the vertex without intravenous contrast. RADIATION DOSE REDUCTION: This exam was performed according to the departmental dose-optimization program which includes automated exposure control, adjustment of the mA and/or kV according to patient size and/or use of iterative reconstruction technique. COMPARISON:  CTA head/neck 09/19/2022. FINDINGS: Brain: Large left frontoparietal convexity subdural hematoma measuring up to 20 mm in the coronal plane (coronal image 36 series 5) with 18 mm of rightward midline shift and left uncal herniation with torsion of the brainstem and entrapment of the lateral ventricles at the level of the third ventricle. Partial effacement of the suprasellar and prepontine cisterns. No tonsillar herniation at this time. Vascular: No hyperdense vessel or unexpected calcification. Skull: No calvarial fracture or suspicious bone lesion. Skull base is unremarkable. Sinuses/Orbits: Unremarkable. Other: None. IMPRESSION: Large left frontoparietal convexity subdural hematoma measuring up to 20 mm in the coronal plane with 18 mm of rightward midline shift and left uncal herniation with torsion of the brainstem and entrapment of the lateral ventricles at the level of the third ventricle. Critical Value/emergent results were called by telephone at the time of interpretation on 12/06/2022 at 4:10 pm to provider [facility] , who verbally acknowledged these results. Electronically Signed   By: Orvan Falconer M.D.   On: 12/06/2022 16:14  The results of significant diagnostics from this hospitalization (including imaging, microbiology, ancillary and laboratory) are listed below for reference.     Microbiology: No results found for this or any previous visit (from the past 240 hour(s)).   Labs:  CBC: Recent Labs  Lab 12/14/22 0224  WBC 7.0  HGB 13.6  HCT 38.7*  MCV 87.6   PLT 279   BMP &GFR Recent Labs  Lab 12/14/22 0224  NA 137  K 3.6  CL 103  CO2 25  GLUCOSE 101*  BUN 11  CREATININE 0.80  CALCIUM 9.0  MG 2.1  PHOS 3.7   Estimated Creatinine Clearance: 123.1 mL/min (by C-G formula based on SCr of 0.8 mg/dL). Liver & Pancreas: No results for input(s): "AST", "ALT", "ALKPHOS", "BILITOT", "PROT", "ALBUMIN" in the last 168 hours. No results for input(s): "LIPASE", "AMYLASE" in the last 168 hours. No results for input(s): "AMMONIA" in the last 168 hours. Diabetic: No results for input(s): "HGBA1C" in the last 72 hours. Recent Labs  Lab 12/14/22 1533 12/14/22 1931 12/14/22 2334 12/15/22 0419 12/15/22 0428  GLUCAP 123* 137* 142* 212* 131*   Cardiac Enzymes: No results for input(s): "CKTOTAL", "CKMB", "CKMBINDEX", "TROPONINI" in the last 168 hours. No results for input(s): "PROBNP" in the last 8760 hours. Coagulation Profile: No results for input(s): "INR", "PROTIME" in the last 168 hours. Thyroid Function Tests: No results for input(s): "TSH", "T4TOTAL", "FREET4", "T3FREE", "THYROIDAB" in the last 72 hours. Lipid Profile: No results for input(s): "CHOL", "HDL", "LDLCALC", "TRIG", "CHOLHDL", "LDLDIRECT" in the last 72 hours. Anemia Panel: No results for input(s): "VITAMINB12", "FOLATE", "FERRITIN", "TIBC", "IRON", "RETICCTPCT" in the last 72 hours. Urine analysis:    Component Value Date/Time   COLORURINE YELLOW 12/06/2022 1457   APPEARANCEUR CLEAR 12/06/2022 1457   LABSPEC 1.010 12/06/2022 1457   PHURINE 6.0 12/06/2022 1457   GLUCOSEU NEGATIVE 12/06/2022 1457   HGBUR NEGATIVE 12/06/2022 1457   BILIRUBINUR NEGATIVE 12/06/2022 1457   KETONESUR NEGATIVE 12/06/2022 1457   PROTEINUR NEGATIVE 12/06/2022 1457   NITRITE NEGATIVE 12/06/2022 1457   LEUKOCYTESUR NEGATIVE 12/06/2022 1457   Sepsis Labs: Invalid input(s): "PROCALCITONIN", "LACTICIDVEN"   SIGNED:  Almon Hercules, MD  [facility] 12/18/2022, 5:46 PM

## 2022-12-18 NOTE — Consult Note (Signed)
Brief Psychiatry Consult Note  Psychiatry consulted to evaluate pt for capacity, cognition, and agitation after spending the AM of today agitated.  He was seen by the psychiatry service on 7/1 - capacity was not formally evaluated however he demonstrated some understanding and participation in his treatment plan. He was diagnosed with GAD with panic attacks and nicotine use disorder and had no SI, HI or perceptual disturbances at the time of consult on 7/1. He was felt to have capacity by primary team on 7/2 and demanded to leave AMA on AM of 7/3 after his mom refused to pick him up from the hospital.   Unfortunately he left AMA prior to being seen by psychiatry.   Joshua Bishop A Joshua Bishop

## 2022-12-18 NOTE — Progress Notes (Signed)
Occupational Therapy Treatment Patient Details Name: Joshua Bishop MRN: [id number] DOB: [birth year] Today's Date: 12/18/2022   History of present illness 42 yo male admitted 12/06/22 due to AMS, found to have large L subacute SDH, now s/p L crani on 6/21 for evacuation of SDH.  PMH anxiety, panic attacks, 04/24 SAH.   OT comments  Pt demonstrates cognitive and balance deficits at this time. Pt calmed with music and movement. Pt fixated on seeing his child and d/c to get to her. Pt unable to demonstrate calling his family despite multiple attempts with the phone number in his hand. Pt is not safe to d/c home alone or leave ama. Recommendation for continued rehab at this time.      Recommendations for follow up therapy are one component of a multi-disciplinary discharge planning process, led by the attending physician.  Recommendations may be updated based on patient status, additional functional criteria and insurance authorization.    Assistance Recommended at Discharge Intermittent Supervision/Assistance  Patient can return home with the following  A little help with walking and/or transfers;A little help with bathing/dressing/bathroom;Assist for transportation;Direct supervision/assist for medications management;Direct supervision/assist for financial management   Equipment Recommendations  None recommended by OT    Recommendations for Other Services Speech consult (cognition)    Precautions / Restrictions Precautions Precautions: Fall       Mobility Bed Mobility Overal bed mobility: Modified Independent                  Transfers Overall transfer level: Needs assistance   Transfers: Sit to/from Stand Sit to Stand: Min guard                 Balance           Standing balance support: Single extremity supported Standing balance-Leahy Scale: Poor Standing balance comment: reliant on external supports at this time                     Dynamic  Gait Index Level Surface: Mild Impairment Change in Gait Speed: Mild Impairment Gait with Horizontal Head Turns: Moderate Impairment Gait with Vertical Head Turns: Moderate Impairment Gait and Pivot Turn: Moderate Impairment     ADL either performed or assessed with clinical judgement   ADL Overall ADL's : Needs assistance/impaired Eating/Feeding: Set up;Sitting   Grooming: Minimal assistance;Standing                   Toilet Transfer: Minimal assistance;Ambulation           Functional mobility during ADLs: Minimal assistance General ADL Comments: pt noted to have movement of arms and legs at rest unintentional movements. Pt noted to sway posterior with static standing. pt needs to hold onto rail on side of hallway. pt states they gave me that damn medication now my balance is off.    Extremity/Trunk Assessment Upper Extremity Assessment Upper Extremity Assessment: Generalized weakness   Lower Extremity Assessment Lower Extremity Assessment: Defer to PT evaluation        Vision       Perception     Praxis      Cognition Arousal/Alertness: Awake/alert Behavior During Therapy: Impulsive Overall Cognitive Status: Impaired/Different from baseline Area of Impairment: Safety/judgement, Memory, Orientation, Attention, Awareness, Problem solving               Rancho Levels of Cognitive Functioning [place]ant Scales of Cognitive Functioning: Confused/Agitated Orientation Level: Disoriented to, Time, Situation Current Attention Level: Sustained Memory:  Decreased recall of precautions, Decreased short-term memory Following Commands: Follows one step commands inconsistently, Follows one step commands with increased time Safety/Judgement: Decreased awareness of safety, Decreased awareness of deficits Awareness: Intellectual Problem Solving: Slow processing, Decreased initiation, Difficulty sequencing, Requires verbal cues, Requires tactile cues General  Comments: pt fixated on arrival with d/c home. pt demonstrates strong bias to cultural difference with medical team. pt responding to certain staff better due to no conflict with pt's personal bias. pt reports date is july 1 and he wants to be home with his family. pt oriented to date and day of week. pt reports being admitted for 8 weeks and tangential about duration of time. pt initially reports he was in prison and pt educated [facility]. pt repeating information to help with reacll. [place]ant Scales of Cognitive Functioning: Confused/Agitated      Exercises Exercises: Other exercises Other Exercises Other Exercises: pt ambulating in the hall with cognitive challenges that cause pt to stop ambulation and lateral lean on surface due to decrease balance with dual task. pt needed min cues to locate his room showing decreased pathfinding. pt unable to complete word retrieval when naming fruits on the staff board. pt called a pineapple the wrong name. pt calling a watermelon by strawberry. pt shown strawberry and pt seems surprised by his error. pt calmed by music and conversation walking. pt states "i can't take being stuck in that room all day verbalizing increase anxiety.    Shoulder Instructions       General Comments Pt reports at baseline not being able to read/ spell. pt does recognize numbers as he was able to read his mother phone number off a piece of paper.    Pertinent Vitals/ Pain       Pain Assessment Pain Assessment: No/denies pain  Home Living                                          Prior Functioning/Environment              Frequency  Min 2X/week        Progress Toward Goals  OT Goals(current goals can now be found in the care plan section)  Progress towards OT goals: Progressing toward goals  Acute Rehab OT Goals Patient Stated Goal: to go home today then later in session said he coudl wait a little longer OT Goal  Formulation: Patient unable to participate in goal setting Time For Goal Achievement: 12/24/22 Potential to Achieve Goals: Good ADL Goals Pt Will Perform Upper Body Bathing: with set-up;sitting Pt Will Perform Lower Body Bathing: with set-up;sit to/from stand Pt Will Perform Upper Body Dressing: with set-up;sitting Pt Will Perform Lower Body Dressing: with set-up;with min assist;sit to/from stand Pt Will Transfer to Toilet: with min assist;ambulating;regular height toilet Additional ADL Goal #1: pt will complete 3 step command 50% of attempts Additional ADL Goal #2: pt will complete 5 step pathfinding task with written instructions  Plan Discharge plan needs to be updated    Co-evaluation                 AM-PAC OT "6 Clicks" Daily Activity     Outcome Measure   Help from another person eating meals?: A Little Help from another person taking care of personal grooming?: A Little Help from another person toileting, which includes using toliet, bedpan,  or urinal?: A Lot Help from another person bathing (including washing, rinsing, drying)?: A Lot Help from another person to put on and taking off regular upper body clothing?: A Little Help from another person to put on and taking off regular lower body clothing?: A Lot 6 Click Score: 15    End of Session Equipment Utilized During Treatment: Gait belt  OT Visit Diagnosis: Unsteadiness on feet (R26.81);Muscle weakness (generalized) (M62.81);Cognitive communication deficit (R41.841)   Activity Tolerance Patient tolerated treatment well   Patient Left in bed;with call bell/phone within reach;with bed alarm set   Nurse Communication Mobility status;Precautions        Time: 5409-8119 OT Time Calculation (min): 34 min  Charges: OT General Charges $OT Visit: 1 Visit OT Treatments $Self Care/Home Management : 8-22 mins $Cognitive Funtion inital: Initial 15 mins   Brynn, OTR/L  Acute Rehabilitation Services Office:  [phone] .   Mateo Flow 12/18/2022, 3:13 PM

## 2022-12-18 NOTE — Progress Notes (Addendum)
Speech Language Pathology    Patient Details Name: Joshua Bishop MRN: [id number] DOB: 08-Dec-1980 Today's Date: 12/18/2022 Time:  -     Received order for speech-language-cognitive assessment. Pt in hall and politely refused to work with therapist after much encouragement. He states he is leaving and needs to go see his children. Pt walked away from therapist and may have walked off the unit? Therapist will sign off.     Royce Macadamia  12/18/2022, 1:03 PM

## 2022-12-19 ENCOUNTER — Emergency Department (HOSPITAL_COMMUNITY): Payer: Medicaid Other

## 2022-12-19 ENCOUNTER — Emergency Department (HOSPITAL_COMMUNITY)
Admission: EM | Admit: 2022-12-19 | Discharge: 2022-12-21 | Disposition: A | Discharge status: Home / Self Care | Payer: Medicaid Other | Attending: Emergency Medicine | Admitting: Emergency Medicine

## 2022-12-19 ENCOUNTER — Other Ambulatory Visit: Payer: Self-pay

## 2022-12-19 ENCOUNTER — Encounter (HOSPITAL_COMMUNITY): Payer: Self-pay | Admitting: Emergency Medicine

## 2022-12-19 DIAGNOSIS — F411 Generalized anxiety disorder: Secondary | ICD-10-CM | POA: Insufficient documentation

## 2022-12-19 DIAGNOSIS — R4182 Altered mental status, unspecified: Secondary | ICD-10-CM | POA: Insufficient documentation

## 2022-12-19 DIAGNOSIS — F17210 Nicotine dependence, cigarettes, uncomplicated: Secondary | ICD-10-CM | POA: Diagnosis not present

## 2022-12-19 DIAGNOSIS — D72829 Elevated white blood cell count, unspecified: Secondary | ICD-10-CM | POA: Insufficient documentation

## 2022-12-19 DIAGNOSIS — R45851 Suicidal ideations: Secondary | ICD-10-CM

## 2022-12-19 DIAGNOSIS — R4689 Other symptoms and signs involving appearance and behavior: Secondary | ICD-10-CM

## 2022-12-19 LAB — CBC
HCT: 45.9 % (ref 39.0–52.0)
Hemoglobin: 15.4 g/dL (ref 13.0–17.0)
MCH: 29.9 pg (ref 26.0–34.0)
MCHC: 33.6 g/dL (ref 30.0–36.0)
MCV: 89.1 fL (ref 80.0–100.0)
Platelets: 523 10*3/uL — ABNORMAL HIGH (ref 150–400)
RBC: 5.15 MIL/uL (ref 4.22–5.81)
RDW: 12.8 % (ref 11.5–15.5)
WBC: 17.1 10*3/uL — ABNORMAL HIGH (ref 4.0–10.5)
nRBC: 0 % (ref 0.0–0.2)

## 2022-12-19 LAB — COMPREHENSIVE METABOLIC PANEL
ALT: 94 U/L — ABNORMAL HIGH (ref 0–44)
AST: 38 U/L (ref 15–41)
Albumin: 4.6 g/dL (ref 3.5–5.0)
Alkaline Phosphatase: 87 U/L (ref 38–126)
Anion gap: 11 (ref 5–15)
BUN: 26 mg/dL — ABNORMAL HIGH (ref 6–20)
CO2: 23 mmol/L (ref 22–32)
Calcium: 9.5 mg/dL (ref 8.9–10.3)
Chloride: 95 mmol/L — ABNORMAL LOW (ref 98–111)
Creatinine, Ser: 1.1 mg/dL (ref 0.61–1.24)
GFR, Estimated: >60 mL/min (ref >60)
Glucose, Bld: 109 mg/dL — ABNORMAL HIGH (ref 70–99)
Potassium: 4.3 mmol/L (ref 3.5–5.1)
Sodium: 129 mmol/L — ABNORMAL LOW (ref 135–145)
Total Bilirubin: 0.7 mg/dL (ref 0.3–1.2)
Total Protein: 8.2 g/dL — ABNORMAL HIGH (ref 6.5–8.1)

## 2022-12-19 LAB — RAPID URINE DRUG SCREEN, HOSP PERFORMED
Amphetamines: NOT DETECTED
Barbiturates: POSITIVE — AB
Benzodiazepines: POSITIVE — AB
Cocaine: NOT DETECTED
Opiates: NOT DETECTED
Tetrahydrocannabinol: NOT DETECTED

## 2022-12-19 LAB — ETHANOL: Alcohol, Ethyl (B): <10 mg/dL (ref <10)

## 2022-12-19 LAB — ACETAMINOPHEN LEVEL: Acetaminophen (Tylenol), Serum: <10 ug/mL — ABNORMAL LOW (ref 10–30)

## 2022-12-19 LAB — SALICYLATE LEVEL: Salicylate Lvl: <7 mg/dL — ABNORMAL LOW (ref 7.0–30.0)

## 2022-12-19 MED ORDER — QUETIAPINE FUMARATE 100 MG PO TABS
100.0000 mg | ORAL_TABLET | Freq: Every day | ORAL | Status: DC
  Administered 2022-12-19 – 2022-12-21 (×3): 100 mg via ORAL
  Filled 2022-12-19 (×3): qty 1

## 2022-12-19 MED ORDER — LORAZEPAM 1 MG PO TABS
1.0000 mg | ORAL_TABLET | Freq: Once | ORAL | Status: AC
  Administered 2022-12-19: 1 mg via ORAL
  Filled 2022-12-19: qty 1

## 2022-12-19 MED ORDER — DIAZEPAM 5 MG PO TABS
10.0000 mg | ORAL_TABLET | Freq: Once | ORAL | Status: AC
  Administered 2022-12-19: 10 mg via ORAL
  Filled 2022-12-19: qty 2

## 2022-12-19 MED ORDER — QUETIAPINE FUMARATE 100 MG PO TABS
200.0000 mg | ORAL_TABLET | Freq: Every day | ORAL | Status: DC
  Administered 2022-12-19 – 2022-12-20 (×2): 200 mg via ORAL
  Filled 2022-12-19 (×2): qty 2

## 2022-12-19 MED ORDER — DIVALPROEX SODIUM 125 MG PO CSDR: 125.0000 mg | DELAYED_RELEASE_CAPSULE | Freq: Two times a day (BID) | ORAL | Status: DC

## 2022-12-19 MED ORDER — HYDROXYZINE HCL 25 MG PO TABS
25.0000 mg | ORAL_TABLET | Freq: Two times a day (BID) | ORAL | Status: DC | PRN
  Administered 2022-12-19 – 2022-12-20 (×2): 25 mg via ORAL
  Filled 2022-12-19 (×2): qty 1

## 2022-12-19 NOTE — ED Triage Notes (Signed)
Apparently Pt was brought into the hospital under IVC. Report was not given to RN.  According to IVC paperwork he has not been sleeping/eating or taking his medications.  It also indicated that pt is paranoid that people are trying to kill him and he is on the run from prison.  Pt has also been threatening to fight his family members.

## 2022-12-19 NOTE — ED Notes (Signed)
Patients girl friends phone number   [phone]

## 2022-12-19 NOTE — ED Notes (Signed)
Sister in law called to check on pt. She states that since pts head injury he has had very aggressive behavior and has not even been able to be around his own daughter. She states that he was nothing like that before his head injury. Pt has been cooperative with staff this morning with slight agitation. Pt wants to go home and also stated that "Jesus is coming back for all the people that lied to put me in here".

## 2022-12-19 NOTE — ED Triage Notes (Signed)
Patient bib [employer] IVC IVC states patient not sleeping, eating, or taking medications Has threatened to cause harm to family Began running in road stating "I can die" while hitting self in head. Patients thoughts very disorganized in triage.

## 2022-12-19 NOTE — Progress Notes (Signed)
LCSW Progress Note  [id number]   Joshua Bishop  12/19/2022  2:51 PM  Description:   Inpatient Psychiatric Referral  Patient was recommended inpatient per [facility], PMHNP. There are no available beds at [facility], per [facility] Rona Ravens, RN. Patient was referred to the following out of network facilities:   [facility] Provider Address Phone Fax  [facility]  [address], [place] [address] [phone]  [facility]  [address] [place] [address] [phone]  [facility] [address]  [address] [place] [address] [phone]  [facility]  [phone] [place] [address] [phone]  [facility]  [address] [place], [place] [address] [phone]  [facility]  [address] [place] [address] [phone]  [facility]  [address], [place] [address] [phone]  [facility]  [address] [place] [address] [phone]  [facility]  [address], [place] [address] [phone]  [facility]  [address] [place] [address] [phone]  [facility]  [address] [place] [address] [phone]  [facility]  [address], [place] [address] [phone]  [facility]  [address], [place] [address] [phone]  [facility]  [address] [place] [address] [phone]  [facility]  [address], [place] [address] [phone]  [facility]  [address] [place] [address] [phone]  [facility]  [address], [place] [address] [phone]  [facility]  [address] [place], [place] [address] [phone]  [facility]  [address] [place] [address] [phone]  [facility]  [address] [place], [place] [address] [phone]  [facility] [place] [address] [phone]  [facility]  [address] [place] [address] [phone]  [facility]  [address] [place] [address] [phone]  [facility]  [address] [place] [address] [phone]  [facility]  [address] [place] [address] [phone]  [facility]  [address] [place] [address] [phone]    Situation ongoing, CSW to continue following and update chart as more information becomes available.      Cathie Beams, [place]  12/19/2022 2:51 PM

## 2022-12-19 NOTE — Consult Note (Signed)
[facility] ED ASSESSMENT   Reason for Consult: IVC Referring Physician:  Dr. Adela Lank  Patient Identification: Joshua Bishop MRN:  [id number] ED Chief Complaint: GAD (generalized anxiety disorder)  Diagnosis:  Principal Problem:   GAD (generalized anxiety disorder) Active Problems:   Aggression   ED Assessment Time Calculation: Start Time: 0945 Stop Time: 1020 Total Time in Minutes (Assessment Completion): 35   Subjective: Per Triage Note "Patient is under police custody police tell me that he reportedly was running in between cars trying to get struck. IVC paperwork was filled out by his mother. Police did not witness any of this activity."    HPI: Joshua Bishop, 42 y.o., male patient seen face to face by this provider, consulted with Dr. Lucianne Muss; and chart reviewed on 12/19/22.  On evaluation Demorian B Eblen reports that he was helping his brother install a pool, and fell backwards and hit his head on the pool, which led to patient being altered mentally due to a large frontoparietal left-sided subacute subdural hematoma with severe cerebral edema and left-sided uncal herniation.  Patient stated that he does not know why he is here in the hospital, stated that his family brought him here, because they do not want him at home.  He states "they do not know I have Jesus in my life now, since I had the surgery Jesus was beside me and held my hand", he states that he knows this sounds crazy but he wants to change his life and live for Jesus.  Patient also states that since he has had surgery and been in the hospital for about 9 weeks he is not been able to sleep 40-year-old daughter Joshua Bishop, states that he and daughter had a good relationship they are playful with each other, but he states he and her mother argue a lot.  Patient currently denies SI/HI/AVH, denies paranoia states he does not feel that anyone is chasing him or after him.  States he lives in between houses, between his mom's house  and his brother's home.  States that he has a good appetite and sleeps well, no issues.  Patient denies using any alcohol or drugs patient UDS positive for barbiturates and benzodiazepines, which was given here in the hospital.  Has been BAL less than 10.  Patient states the drugs increased swelling and bleeding on the brain, "so I do not use any drugs."  Patient states that he has never been admitted to an inpatient psychiatric facility but, states he has been to prison in his 30s.  Patient states that he does not take any medication, except Xanax.  During evaluation Shephard B Eaddy is sitting on his bed in no acute distress. He is alert, oriented x 3, cooperative and attentive, anxious at times. His mood is euthymic with congruent affect. He has hyper-verbal speech, and appropriate behavior.  Objectively there is no evidence of psychosis/mania or delusional thinking.  Patient is able to converse coherently, goal directed thoughts, no distractibility, or pre-occupation. He currently denies suicidal/self-harm/homicidal ideation, psychosis, and paranoia.  Provided a chart review, was not able to find any indication that patient has been receiving Xanax.  Patient was given Ativan 1 mg every 4 hours as needed ordered by Cloyd Stagers, PA-C on December 16, 2022.   Spoke with patient's sister-in-law Joanna Kainz, she states that patient has a history of cocaine abuse.  States that he has no home, he attempted to live with her his brother, but they have 8 children and  his younger brother has 6 children, so apparently there is no space in their home, states patient's mother is an elderly woman and because of patient's recent aggressive behaviors she is not comfortable with patient coming back to her home current and so he can become stabilized on medications.  She states that before the TBI patient had no aggression, no impulsivity, but now he is called the mother of his child and said he would bite her (the  mother), and according to the IVC paperwork, patient has not been eating/sleeping or taking his medications.  There is also indicated that patient is paranoid and feeling that people are trying to kill him and he is on the run from prison.  Patient also has been threatening to fight his family members.  Patient also has been reportedly wandering in between cars trying to get struck. Mardene Celeste states that the family is willing to take patient back home, once he is stabilized and calmer on his medications.  Aurea Graff and Misty Stanley asked if someone could give the family a call before patient is discharged home.   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, panic attacks   Risk to Self or Others: Risk to Self:  Yes  Risk to Others:  Yes  Prior Inpatient Therapy: No  Prior Outpatient Therapy:  No    [place] Scale:  Flowsheet Row ED from 12/19/2022 in [facility] ED to Hosp-Admission (Discharged) from 12/06/2022 in [facility] [facility] from [facility]/19/2022 [facility]  C-SSRS RISK CATEGORY No Risk No Risk No Risk       AIMS:  , , ,  ,   ASAM:    Substance Abuse:     Past Medical History:  Past Medical History:  Diagnosis Date   Panic attack    Polysubstance abuse (HCC)    Cocaine, BZD   SAH (subarachnoid hemorrhage) (HCC) 09/2022   Traumatic    Past Surgical History:  Procedure Laterality Date   CRANIOTOMY Left 12/06/2022   Procedure: CRANIOTOMY HEMATOMA EVACUATION SUBDURAL;  Surgeon: Bedelia Person, MD;  Location[facility];  Service: Neurosurgery;  Laterality: Left;   Family History:  Family History  Problem Relation Age of Onset   Cancer Other     Social History:  Social History   Substance and Sexual Activity  Alcohol Use Yes   Comment: occ     Social History   Substance and Sexual Activity  Drug Use Never    Social History    Socioeconomic History   Marital status: Legally Separated    Spouse name: Not on file   Number of children: Not on file   Years of education: Not on file   Highest education level: Not on file  Occupational History   Not on file  Tobacco Use   Smoking status: Every Day    Packs/day: .5    Types: Cigarettes   Smokeless tobacco: Never  Vaping Use   Vaping Use: Never used  Substance and Sexual Activity   Alcohol use: Yes    Comment: occ   Drug use: Never   Sexual activity: Yes    Birth control/protection: None  Other Topics Concern   Not on file  Social History Narrative   Not on file   Social Determinants of Health   Financial Resource Strain: Not on file  Food Insecurity: Not on file  Transportation Needs: Not on  file  Physical Activity: Not on file  Stress: Not on file  Social Connections: Not on file    Allergies:   Allergies  Allergen Reactions   Zidovudine Other (See Comments)    `unknown     Labs:  Results for orders placed or performed during the hospital encounter of 12/19/22 (from the past 48 hour(s))  Comprehensive metabolic panel     Status: Abnormal   Collection Time: 12/19/22  2:00 AM  Result Value Ref Range   Sodium 129 (L) 135 - 145 mmol/L   Potassium 4.3 3.5 - 5.1 mmol/L   Chloride 95 (L) 98 - 111 mmol/L   CO2 23 22 - 32 mmol/L   Glucose, Bld 109 (H) 70 - 99 mg/dL    Comment: Glucose reference range applies only to samples taken after fasting for at least 8 hours.   BUN 26 (H) 6 - 20 mg/dL   Creatinine, Ser 1.61 0.61 - 1.24 mg/dL   Calcium 9.5 8.9 - 09.6 mg/dL   Total Protein 8.2 (H) 6.5 - 8.1 g/dL   Albumin 4.6 3.5 - 5.0 g/dL   AST 38 15 - 41 U/L   ALT 94 (H) 0 - 44 U/L   Alkaline Phosphatase 87 38 - 126 U/L   Total Bilirubin 0.7 0.3 - 1.2 mg/dL   GFR, Estimated >04 >54 mL/min    Comment: (NOTE) Calculated using the CKD-EPI Creatinine Equation (2021)    Anion gap 11 5 - 15    Comment: Performed at [facility],  [address] [place], [place] [address]  Ethanol     Status: None   Collection Time: 12/19/22  2:00 AM  Result Value Ref Range   Alcohol, Ethyl (B) <10 <10 mg/dL    Comment: (NOTE) Lowest detectable limit for serum alcohol is 10 mg/dL.  For medical purposes only. Performed at [facility], [address], [place], [place] [address]   Salicylate level     Status: Abnormal   Collection Time: 12/19/22  2:00 AM  Result Value Ref Range   Salicylate Lvl <7.0 (L) 7.0 - 30.0 mg/dL    Comment: Performed at [facility], [address], [place], [place] [address]  Acetaminophen level     Status: Abnormal   Collection Time: 12/19/22  2:00 AM  Result Value Ref Range   Acetaminophen (Tylenol), Serum <10 (L) 10 - 30 ug/mL    Comment: (NOTE) Therapeutic concentrations vary significantly. A range of 10-30 ug/mL  may be an effective concentration for many patients. However, some  are best treated at concentrations outside of this range. Acetaminophen concentrations >150 ug/mL at 4 hours after ingestion  and >50 ug/mL at 12 hours after ingestion are often associated with  toxic reactions.  Performed at [facility], [address], [place], [place] [address]   cbc     Status: Abnormal   Collection Time: 12/19/22  2:00 AM  Result Value Ref Range   WBC 17.1 (H) 4.0 - 10.5 K/uL   RBC 5.15 4.22 - 5.81 MIL/uL   Hemoglobin 15.4 13.0 - 17.0 g/dL   HCT 57.8 46.9 - 62.9 %   MCV 89.1 80.0 - 100.0 fL   MCH 29.9 26.0 - 34.0 pg   MCHC 33.6 30.0 - 36.0 g/dL   RDW 52.8 41.3 - 24.4 %   Platelets 523 (H) 150 - 400 K/uL   nRBC 0.0 0.0 - 0.2 %    Comment: Performed at  [facility], [address], [place], [place] [address]  Rapid urine drug screen (hospital performed)     Status: Abnormal   Collection Time: 12/19/22  3:56 AM  Result Value Ref Range   Opiates NONE DETECTED NONE DETECTED   Cocaine NONE DETECTED NONE DETECTED    Benzodiazepines POSITIVE (A) NONE DETECTED   Amphetamines NONE DETECTED NONE DETECTED   Tetrahydrocannabinol NONE DETECTED NONE DETECTED   Barbiturates POSITIVE (A) NONE DETECTED    Comment: (NOTE) DRUG SCREEN FOR MEDICAL PURPOSES ONLY.  IF CONFIRMATION IS NEEDED FOR ANY PURPOSE, NOTIFY LAB WITHIN 5 DAYS.  LOWEST DETECTABLE LIMITS FOR URINE DRUG SCREEN Drug Class                     Cutoff (ng/mL) Amphetamine and metabolites    1000 Barbiturate and metabolites    200 Benzodiazepine                 200 Opiates and metabolites        300 Cocaine and metabolites        300 THC                            50 Performed at [facility], [address], [place], [place] [address]     Current Facility-Administered Medications  Medication Dose Route Frequency Provider Last Rate Last Admin   hydrOXYzine (ATARAX) tablet 25 mg  25 mg Oral BID PRN Motley-Mangrum, Lacey Dotson A, PMHNP       QUEtiapine (SEROQUEL) tablet 100 mg  100 mg Oral Daily Motley-Mangrum, Zuzu Befort A, PMHNP   100 mg at 12/19/22 1225   QUEtiapine (SEROQUEL) tablet 200 mg  200 mg Oral QHS Motley-Mangrum, Naiara Lombardozzi A, PMHNP       No current outpatient medications on file.    Musculoskeletal: Strength & Muscle Tone: within normal limits Gait & Station: normal Patient leans: N/A   Psychiatric Specialty Exam: Presentation  General Appearance:  Appropriate for Environment  Eye Contact: Fair  Speech: Clear and Coherent  Speech Volume: Normal  Handedness: Right   Mood and Affect  Mood: Euthymic; Anxious  Affect: Appropriate; Full Range   Thought Process  Thought Processes: Coherent  Descriptions of Associations:Intact  Orientation:Full (Time, Place and Person)  Thought Content:Logical  History of Schizophrenia/Schizoaffective disorder:No data recorded Duration of Psychotic Symptoms:No data recorded Hallucinations:Hallucinations: None  Ideas of Reference:None  Suicidal  Thoughts:Suicidal Thoughts: No  Homicidal Thoughts:Homicidal Thoughts: No   Sensorium  Memory: Immediate Fair; Recent Fair  Judgment: Fair  Insight: Poor   Executive Functions  Concentration: Fair  Attention Span: Fair  Recall: [employer] of Knowledge: Fair  Language: Fair   Psychomotor Activity  Psychomotor Activity: Psychomotor Activity: Normal   Assets  Assets: Communication Skills; Desire for Improvement; Financial Resources/Insurance; Social Support    Sleep  Sleep: Sleep: Fair   Physical Exam: Physical Exam Vitals and nursing note reviewed. Exam conducted with a chaperone present.  Neurological:     Mental Status: He is alert.  Psychiatric:        Attention and Perception: Attention normal.        Mood and Affect: Mood is anxious. Affect is blunt.        Speech: Speech normal.        Behavior: Behavior is cooperative.        Thought Content: Thought content is paranoid.  Judgment: Judgment is inappropriate.    Review of Systems  Constitutional: Negative.   Psychiatric/Behavioral:  Positive for depression.    Blood pressure (!) 121/97, pulse (!) 101, temperature 98.3 F (36.8 C), resp. rate 18, height 5\' 10"  (1.778 m), weight 70.9 kg, SpO2 94 %. Body mass index is 22.43 kg/m.    Medical Decision Making: Pt case reviewed and discussed with Dr. Lucianne Muss. Pt does meet criteria for IVC and inpatient psychiatric treatment. Patient needs inpatient psychiatric admission for stabilization and treatment. [facility] and CSW notified of disposition. Will restart Seroquel 100 mg daily and 200 mg @ hs for mood, and Ativan q 4 hrs PRN for anxiety. EDP, RN, and LCSW notified of disposition.     Disposition: Recommend psychiatric Inpatient admission.  Alona Bene, PMHNP 12/19/2022 1:55 PM

## 2022-12-19 NOTE — ED Provider Notes (Signed)
[place] EMERGENCY DEPARTMENT AT [facility] Provider Note   CSN: [id number] Arrival date & time: 12/19/22  0108     History  Chief Complaint  Patient presents with   IVC    Joshua Bishop is a 42 y.o. male.  42 yo M with a chief complaints of being brought here under IVC.  Patient tells me that nothing is wrong.  He really wants to go home and spend time with his daughter.  Tells me that he was just in the hospital and had surgery on his skull.  Tells me that he had left that he could spend time with his daughter.  For some reason was brought back and is not sure why.  Patient is under police custody police tell me that he reportedly was running in between cars trying to get struck.  IVC paperwork was filled out by his mother.  Police did not witness any of this activity.        Home Medications Prior to Admission medications   Medication Sig Start Date End Date Taking? Authorizing Provider  ibuprofen (ADVIL,MOTRIN) 200 MG tablet Take 600 mg by mouth every 6 (six) hours as needed for moderate pain.    [provider]      Allergies    Zidovudine    Review of Systems   Review of Systems  Physical Exam Updated Vital Signs BP 114/84 (BP Location: Right Arm)   Pulse (!) 131   Temp 98.3 F (36.8 C) (Oral)   Resp 20   Ht 5\' 10"  (1.778 m)   Wt 70.9 kg   SpO2 100%   BMI 22.43 kg/m  Physical Exam Vitals and nursing note reviewed.  Constitutional:      Appearance: He is well-developed.  HENT:     Head: Normocephalic and atraumatic.     Comments: Left craniotomy scar Eyes:     Pupils: Pupils are equal, round, and reactive to light.  Neck:     Vascular: No JVD.  Cardiovascular:     Rate and Rhythm: Normal rate and regular rhythm.     Heart sounds: No murmur heard.    No friction rub. No gallop.  Pulmonary:     Effort: No respiratory distress.     Breath sounds: No wheezing.  Abdominal:     General: There is no distension.      Tenderness: There is no abdominal tenderness. There is no guarding or rebound.  Musculoskeletal:        General: Normal range of motion.     Cervical back: Normal range of motion and neck supple.  Skin:    Coloration: Skin is not pale.     Findings: No rash.  Neurological:     Mental Status: He is alert and oriented to person, place, and time.  Psychiatric:        Behavior: Behavior normal.     ED Results / Procedures / Treatments   Labs (all labs ordered are listed, but only abnormal results are displayed) Labs Reviewed  COMPREHENSIVE METABOLIC PANEL - Abnormal; Notable for the following components:      Result Value   Sodium 129 (*)    Chloride 95 (*)    Glucose, Bld 109 (*)    BUN 26 (*)    Total Protein 8.2 (*)    ALT 94 (*)    All other components within normal limits  SALICYLATE LEVEL - Abnormal; Notable for the following components:   Salicylate Lvl <  7.0 (*)    All other components within normal limits  ACETAMINOPHEN LEVEL - Abnormal; Notable for the following components:   Acetaminophen (Tylenol), Serum <10 (*)    All other components within normal limits  CBC - Abnormal; Notable for the following components:   WBC 17.1 (*)    Platelets 523 (*)    All other components within normal limits  RAPID URINE DRUG SCREEN, HOSP PERFORMED - Abnormal; Notable for the following components:   Benzodiazepines POSITIVE (*)    Barbiturates POSITIVE (*)    All other components within normal limits  ETHANOL    EKG EKG Interpretation Date/Time:  Thursday December 19 2022 01:48:56 EDT Ventricular Rate:  115 PR Interval:  138 QRS Duration:  91 QT Interval:  310 QTC Calculation: 429 R Axis:   114  Text Interpretation: Sinus tachycardia Left posterior fascicular block Abnormal R-wave progression, late transition Since last tracing rate faster Otherwise no significant change Confirmed by Melene Plan [phone]) on 12/19/2022 2:02:08 AM  Radiology CT Head Wo Contrast  Result Date:  12/19/2022 CLINICAL DATA:  Altered mental status. Craniotomy recently for subdural hemorrhage. EXAM: CT HEAD WITHOUT CONTRAST TECHNIQUE: Contiguous axial images were obtained from the base of the skull through the vertex without intravenous contrast. RADIATION DOSE REDUCTION: This exam was performed according to the departmental dose-optimization program which includes automated exposure control, adjustment of the mA and/or kV according to patient size and/or use of iterative reconstruction technique. COMPARISON:  None Available. FINDINGS: Brain: Small amount of residual pneumocephalus and extra-axial fluid over the left convexity. No acute hemorrhage. No midline shift or mass effect. Vascular: No abnormal hyperdensity of the major intracranial arteries or dural venous sinuses. No intracranial atherosclerosis. Skull: Recent left-sided craniotomy. Small overlying fluid collection. Sinuses/Orbits: No fluid levels or advanced mucosal thickening of the visualized paranasal sinuses. No mastoid or middle ear effusion. The orbits are normal. IMPRESSION: 1. Recent left-sided craniotomy with small amount of residual pneumocephalus and aging extra-axial blood over the left convexity. 2. No acute hemorrhage. Electronically Signed   By: Deatra Robinson M.D.   On: 12/19/2022 03:12    Procedures Procedures    Medications Ordered in ED Medications  diazepam (VALIUM) tablet 10 mg (10 mg Oral Given 12/19/22 0418)    ED Course/ Medical Decision Making/ A&P                             Medical Decision Making Amount and/or Complexity of Data Reviewed Labs: ordered. Radiology: ordered.  Risk Prescription drug management.   42 yo M with a chief complaints of reported suicidal ideation.  Interestingly the patient left yesterday and reportedly had capacity.  He left AGAINST MEDICAL ADVICE.  Had IVC paperwork filled out by his mother.  Patient adamantly denies this.  Tells me was to go home and spend time with his  daughter.  On my record review the patient actually came in with altered mental status and was found to have a fairly large intracranial hemorrhage.  Required craniotomy and ICU admission and intubation.  Will obtain a CT of the head.  I feel the patient is medically clear for TTS evaluation post.  CT of the head without any concerning recurrent bleeding.  Patient has a mild low sodium and chloride level.  Mild leukocytosis.  Feel the patient is medically clear for TTS evaluation.  The patients results and plan were reviewed and discussed.   Any x-rays performed were  independently reviewed by myself.   Differential diagnosis were considered with the presenting HPI.  Medications  diazepam (VALIUM) tablet 10 mg (10 mg Oral Given 12/19/22 0418)    Vitals:   12/19/22 0131 12/19/22 0146  BP: 114/84   Pulse: (!) 131   Resp: 20   Temp: 98.3 F (36.8 C)   TempSrc: Oral   SpO2: 100%   Weight:  70.9 kg  Height:  5\' 10"  (1.778 m)    Final diagnoses:  Suicidal ideation    Admission/ observation were discussed with the admitting physician, patient and/or family and they are comfortable with the plan.          Final Clinical Impression(s) / ED Diagnoses Final diagnoses:  Suicidal ideation    Rx / DC Orders ED Discharge Orders     None         Melene Plan, DO 12/19/22 [phone]

## 2022-12-19 NOTE — ED Notes (Signed)
Sister in laws phone number    [phone]

## 2022-12-19 NOTE — Progress Notes (Addendum)
Pt has been denied by [facility] due to recent head trauma and craniotomy procedure. CSW will continue to assist with placement.  Cathie Beams, [place]  12/19/2022 3:26 PM

## 2022-12-19 NOTE — ED Notes (Signed)
Pt has been sleeping for the past hour.

## 2022-12-19 NOTE — ED Notes (Addendum)
Pt dressed out into burgundy scrubs. Belongings consists of shorts, shirt, and crocs. Pt belongings placed at nurse station [place] [address] 19-22. Pt wanded by security.

## 2022-12-19 NOTE — ED Notes (Signed)
Pt Belongings in [facility] locker #40

## 2022-12-20 MED ORDER — IBUPROFEN 200 MG PO TABS: 600.0000 mg | ORAL_TABLET | Freq: Once | ORAL | Status: DC

## 2022-12-20 NOTE — Progress Notes (Signed)
[facility] Psych ED Progress Note  12/20/2022 4:39 PM Joshua Bishop  MRN:  [id number]   Principal Problem: GAD (generalized anxiety disorder) Diagnosis:  Principal Problem:   GAD (generalized anxiety disorder) Active Problems:   Aggression   ED Assessment Time Calculation: Start Time: 0945 Stop Time: 1000 Total Time in Minutes (Assessment Completion): 15   Subjective: On evaluation today, the patient is sitting in his hospital room, on the bed, in no acute distress. He is calm and cooperative during this assessment. His appearance is appropriate for environment. His eye contact is good.  Speech is clear and coherent, rapid pace and normal volume. He reports his mood is "good", he states he is feeling fine and ready to go home and be with his family. Affect is congruent with mood.  Thought process is coherent.  Thought content is slightly scattered but logical . He denies auditory and visual hallucinations.  No indication that he is responding to internal stimuli during this assessment. No delusions elicited during this assessment. He denies suicidal ideations. He denies homicidal ideations. Appetite and sleep are good.  Patient states that he feels like the medication is working, he states that he feels he is ready to go home and see his family, especially his 50-year-old daughter.  He says that he will see an outpatient therapist to help control his anxiety and impulsiveness.  He says that he has 2 brothers, and a mother that he can go and stay with.  Patient adamantly states that he was not trying to kill himself, and does not want to harm himself, states he was trying to cross the street not trying to run into traffic. Objectively there is no evidence of psychosis/mania or delusional thinking.  Patient is able to converse coherently, goal directed thoughts, no distractibility, or pre-occupation.  He denies suicidal/self-harm/homicidal ideation, psychosis, and paranoia.  Patient answered question  appropriately.  Patient compliant with medications.  Patient's behavior appears less anxious today than the previous day, patient is not speaking as fast, his movements are not as fast, patient appears to be more stable on the medications Seroquel and Atarax.  Spoke with patient's brother Ronnie Caskey, he states that the family is still hesitant about patient coming back home, but there is not a facility that will take him and he is compliant and stable with medications, he states that he will be able to pick patient up tomorrow morning.  He states that because the patient has been so aggressive and that the patient and family members are adjusting to patient having a TBI.  He states that he does want his brother to be safe, and have a safe place to go, states that his brother is able to come and stay with him, and his family.  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, panic attacks   [place] Scale:  Flowsheet Row ED from 12/19/2022 in [facility] Emergency Department at [facility] ED to Hosp-Admission (Discharged) from 12/06/2022 in [facility] ED from 09/19/2022 in [facility] Emergency Department at [facility]  C-SSRS RISK CATEGORY No Risk No Risk No Risk       Past Medical History:  Past Medical History:  Diagnosis Date   Panic attack    Polysubstance abuse (HCC)    Cocaine, BZD   SAH (subarachnoid hemorrhage) (HCC) 09/2022   Traumatic    Past Surgical History:  Procedure Laterality Date   CRANIOTOMY Left 12/06/2022   Procedure: CRANIOTOMY  HEMATOMA EVACUATION SUBDURAL;  Surgeon: Bedelia Person, MD;  Location: [facility] OR;  Service: Neurosurgery;  Laterality: Left;   Family History:  Family History  Problem Relation Age of Onset   Cancer Other    Social History:  Social History   Substance and Sexual Activity  Alcohol Use Yes   Comment: occ     Social History   Substance and Sexual Activity   Drug Use Never    Social History   Socioeconomic History   Marital status: Legally Separated    Spouse name: Not on file   Number of children: Not on file   Years of education: Not on file   Highest education level: Not on file  Occupational History   Not on file  Tobacco Use   Smoking status: Every Day    Packs/day: .5    Types: Cigarettes   Smokeless tobacco: Never  Vaping Use   Vaping Use: Never used  Substance and Sexual Activity   Alcohol use: Yes    Comment: occ   Drug use: Never   Sexual activity: Yes    Birth control/protection: None  Other Topics Concern   Not on file  Social History Narrative   Not on file   Social Determinants of Health   Financial Resource Strain: Not on file  Food Insecurity: Not on file  Transportation Needs: Not on file  Physical Activity: Not on file  Stress: Not on file  Social Connections: Not on file    Sleep: Good  Appetite:  Good  Current Medications: Current Facility-Administered Medications  Medication Dose Route Frequency Provider Last Rate Last Admin   hydrOXYzine (ATARAX) tablet 25 mg  25 mg Oral BID PRN Motley-Mangrum, Sonyia Muro A, PMHNP   25 mg at 12/19/22 1808   QUEtiapine (SEROQUEL) tablet 100 mg  100 mg Oral Daily Motley-Mangrum, Sahian Kerney A, PMHNP   100 mg at 12/20/22 0957   QUEtiapine (SEROQUEL) tablet 200 mg  200 mg Oral QHS Motley-Mangrum, Reade Trefz A, PMHNP   200 mg at 12/19/22 2149   No current outpatient medications on file.    Lab Results:  Results for orders placed or performed during the hospital encounter of 12/19/22 (from the past 48 hour(s))  Comprehensive metabolic panel     Status: Abnormal   Collection Time: 12/19/22  2:00 AM  Result Value Ref Range   Sodium 129 (L) 135 - 145 mmol/L   Potassium 4.3 3.5 - 5.1 mmol/L   Chloride 95 (L) 98 - 111 mmol/L   CO2 23 22 - 32 mmol/L   Glucose, Bld 109 (H) 70 - 99 mg/dL    Comment: Glucose reference range applies only to samples taken after fasting for at  least 8 hours.   BUN 26 (H) 6 - 20 mg/dL   Creatinine, Ser 4.09 0.61 - 1.24 mg/dL   Calcium 9.5 8.9 - 81.1 mg/dL   Total Protein 8.2 (H) 6.5 - 8.1 g/dL   Albumin 4.6 3.5 - 5.0 g/dL   AST 38 15 - 41 U/L   ALT 94 (H) 0 - 44 U/L   Alkaline Phosphatase 87 38 - 126 U/L   Total Bilirubin 0.7 0.3 - 1.2 mg/dL   GFR, Estimated >91 >47 mL/min    Comment: (NOTE) Calculated using the CKD-EPI Creatinine Equation (2021)    Anion gap 11 5 - 15    Comment: Performed at [facility], [address] [place], [place] [address]  Ethanol     Status: None  Collection Time: 12/19/22  2:00 AM  Result Value Ref Range   Alcohol, Ethyl (B) <10 <10 mg/dL    Comment: (NOTE) Lowest detectable limit for serum alcohol is 10 mg/dL.  For medical purposes only. Performed at [facility], [address] [place], [place] [address]   Salicylate level     Status: Abnormal   Collection Time: 12/19/22  2:00 AM  Result Value Ref Range   Salicylate Lvl <7.0 (L) 7.0 - 30.0 mg/dL    Comment: Performed at [facility], [address], [place], [place] [address]  Acetaminophen level     Status: Abnormal   Collection Time: 12/19/22  2:00 AM  Result Value Ref Range   Acetaminophen (Tylenol), Serum <10 (L) 10 - 30 ug/mL    Comment: (NOTE) Therapeutic concentrations vary significantly. A range of 10-30 ug/mL  may be an effective concentration for many patients. However, some  are best treated at concentrations outside of this range. Acetaminophen concentrations >150 ug/mL at 4 hours after ingestion  and >50 ug/mL at 12 hours after ingestion are often associated with  toxic reactions.  Performed at [facility], [address] [place], [place] [address]   cbc     Status: Abnormal   Collection Time: 12/19/22  2:00 AM  Result Value Ref Range   WBC 17.1 (H) 4.0 - 10.5 K/uL   RBC 5.15 4.22 - 5.81 MIL/uL   Hemoglobin 15.4 13.0 - 17.0 g/dL    HCT 10.2 72.5 - 36.6 %   MCV 89.1 80.0 - 100.0 fL   MCH 29.9 26.0 - 34.0 pg   MCHC 33.6 30.0 - 36.0 g/dL   RDW 44.0 34.7 - 42.5 %   Platelets 523 (H) 150 - 400 K/uL   nRBC 0.0 0.0 - 0.2 %    Comment: Performed at [facility], [address] [place], [place] [address]  Rapid urine drug screen (hospital performed)     Status: Abnormal   Collection Time: 12/19/22  3:56 AM  Result Value Ref Range   Opiates NONE DETECTED NONE DETECTED   Cocaine NONE DETECTED NONE DETECTED   Benzodiazepines POSITIVE (A) NONE DETECTED   Amphetamines NONE DETECTED NONE DETECTED   Tetrahydrocannabinol NONE DETECTED NONE DETECTED   Barbiturates POSITIVE (A) NONE DETECTED    Comment: (NOTE) DRUG SCREEN FOR MEDICAL PURPOSES ONLY.  IF CONFIRMATION IS NEEDED FOR ANY PURPOSE, NOTIFY LAB WITHIN 5 DAYS.  LOWEST DETECTABLE LIMITS FOR URINE DRUG SCREEN Drug Class                     Cutoff (ng/mL) Amphetamine and metabolites    1000 Barbiturate and metabolites    200 Benzodiazepine                 200 Opiates and metabolites        300 Cocaine and metabolites        300 THC                            50 Performed at [facility], [address], [place], [place] [address]     Blood Alcohol level:  Lab Results  Component Value Date   [facility] <10 12/19/2022   ETH <10 12/06/2022    Physical Findings:  CIWA:    COWS:     Musculoskeletal: Strength & Muscle Tone: within normal limits Gait & Station:  normal Patient leans: N/A  Psychiatric Specialty Exam:  Presentation  General Appearance:  Appropriate for Environment  Eye Contact: Fair  Speech: Clear and Coherent  Speech Volume: Normal  Handedness: Right   Mood and Affect  Mood: Euthymic; Anxious  Affect: Appropriate; Full Range   Thought Process  Thought Processes: Coherent  Descriptions of Associations:Intact  Orientation:Full (Time, Place and Person)  Thought  Content:Logical  History of Schizophrenia/Schizoaffective disorder:No data recorded Duration of Psychotic Symptoms:No data recorded Hallucinations:Hallucinations: None  Ideas of Reference:None  Suicidal Thoughts:Suicidal Thoughts: No  Homicidal Thoughts:Homicidal Thoughts: No   Sensorium  Memory: Immediate Fair; Recent Fair  Judgment: Fair  Insight: Poor   Executive Functions  Concentration: Fair  Attention Span: Fair  Recall: [employer] of Knowledge: Fair  Language: Fair   Psychomotor Activity  Psychomotor Activity: Psychomotor Activity: Normal   Assets  Assets: Communication Skills; Desire for Improvement; Financial Resources/Insurance; Social Support   Sleep  Sleep: Sleep: Fair    Physical Exam: Physical Exam Vitals and nursing note reviewed. Exam conducted with a chaperone present.  Neurological:     Mental Status: He is alert.  Psychiatric:        Attention and Perception: Attention normal.        Mood and Affect: Mood is anxious.        Speech: Speech normal.        Behavior: Behavior is cooperative.        Thought Content: Thought content normal.        Cognition and Memory: Memory normal.        Judgment: Judgment is impulsive and inappropriate.    Review of Systems  Constitutional: Negative.   Psychiatric/Behavioral:  Positive for memory loss.    Blood pressure 123/80, pulse 89, temperature 98.1 F (36.7 C), resp. rate 18, height 5\' 10"  (1.778 m), weight 70.9 kg, SpO2 97 %. Body mass index is 22.43 kg/m.   Medical Decision Making: Patient continues to require inpatient Psychiatric hospitalization. Patient has been faxed out to various facilities and patient has been denied by these inpatient psychiatric facilities due to recent head trauma and craniotomy procedure.  Provider spoke with patient's brother Christen Bame Levick, he stated that since patient is not able to be placed in the inpatient psychiatric facility he will be able  to pick patient up 12/21/22, if patient is psychiatrically cleared, and continues to be compliant with medications.    Juwan Vences MOTLEY-MANGRUM, PMHNP 12/20/2022, 4:39 PM

## 2022-12-20 NOTE — ED Provider Notes (Signed)
Emergency Medicine Observation Re-evaluation Note  Joshua Bishop is a 42 y.o. male, seen on rounds today.  Pt initially presented to the ED for complaints of IVC Currently, the patient is resting.  Physical Exam  BP 123/80 (BP Location: Right Arm)   Pulse 89   Temp 98 F (36.7 C) (Oral)   Resp 18   Ht 5\' 10"  (1.778 m)   Wt 70.9 kg   SpO2 97%   BMI 22.43 kg/m  Physical Exam General: resting comfortably, NAD Lungs: normal WOB Psych: currently calm and resting  ED Course / MDM  EKG:EKG Interpretation Date/Time:  Thursday December 19 2022 01:48:56 EDT Ventricular Rate:  115 PR Interval:  138 QRS Duration:  91 QT Interval:  310 QTC Calculation: 429 R Axis:   114  Text Interpretation: Sinus tachycardia Left posterior fascicular block Abnormal R-wave progression, late transition Since last tracing rate faster Otherwise no significant change Confirmed by Melene Plan [phone]) on 12/19/2022 2:02:08 AM  I have reviewed the labs performed to date as well as medications administered while in observation.  Recent changes in the last 24 hours include none.  Plan  Current plan is for inpatient placement.    Rozelle Logan, DO 12/20/22 1914

## 2022-12-20 NOTE — ED Notes (Signed)
Pt provided with new clean scrubs and socks.

## 2022-12-20 NOTE — ED Notes (Signed)
Geralynn Ochs advised she spoke with pts brother and he would be here to pick him up at 11am on 12/21/22. Pt resting now, so he will be notified of this when he wakes up.

## 2022-12-21 DIAGNOSIS — F411 Generalized anxiety disorder: Secondary | ICD-10-CM

## 2022-12-21 MED ORDER — QUETIAPINE FUMARATE 200 MG PO TABS: 200.0000 mg | ORAL_TABLET | Freq: Every day | ORAL | 0 refills | Status: DC

## 2022-12-21 MED ORDER — QUETIAPINE FUMARATE 100 MG PO TABS: 100.0000 mg | ORAL_TABLET | Freq: Every day | ORAL | 0 refills | Status: DC

## 2022-12-21 MED ORDER — HYDROXYZINE HCL 25 MG PO TABS: 25.0000 mg | ORAL_TABLET | Freq: Two times a day (BID) | ORAL | 0 refills | Status: AC | PRN

## 2022-12-21 NOTE — ED Notes (Signed)
Pt calm and cooperative throughout shift. Compliant with medications and vitals. Denies SI, HI, self-injury, AVH, and mania. Voiced no delusions or paranoid content. Pt confirms all belongings were returned. Brother, Christen Bame, at bedside to transport pt home.

## 2022-12-21 NOTE — ED Provider Notes (Signed)
Emergency Medicine Observation Re-evaluation Note  Joshua Bishop is a 42 y.o. male, seen on rounds today.  Pt initially presented to the ED for complaints of IVC Currently, the patient is awake and alert.  He sustained a SD and TBI on 6/21.  He left AMA on 7/3.  He presented to the ED on 7/4 with IVC papers because he was acting bizarrely.  Pt said he feels better now.   Physical Exam  BP 120/73 (BP Location: Right Arm)   Pulse 72   Temp 98.1 F (36.7 C) (Oral)   Resp 18   Ht 5\' 10"  (1.778 m)   Wt 70.9 kg   SpO2 100%   BMI 22.43 kg/m  Physical Exam General: awake and alert Cardiac: rr Lungs: clear Psych: calm  ED Course / MDM  EKG:EKG Interpretation Date/Time:  Thursday December 19 2022 01:48:56 EDT Ventricular Rate:  115 PR Interval:  138 QRS Duration:  91 QT Interval:  310 QTC Calculation: 429 R Axis:   114  Text Interpretation: Sinus tachycardia Left posterior fascicular block Abnormal R-wave progression, late transition Since last tracing rate faster Otherwise no significant change Confirmed by Melene Plan [phone]) on 12/19/2022 2:02:08 AM  I have reviewed the labs performed to date as well as medications administered while in observation.  Recent changes in the last 24 hours include TTS eval.  Plan  Current plan is for brother is coming to pick up patient today.    Jacalyn Lefevre, MD 12/21/22 [phone]

## 2022-12-21 NOTE — Progress Notes (Signed)
This CSW was contacted via phone by RN at request of Psych NP to add outpatient mental health resources. Resources provided and attached to AVS. No further TOC needs.

## 2022-12-21 NOTE — Discharge Summary (Signed)
[facility] Psych ED Discharge  12/21/2022 11:42 AM Estanislado Score Mcfarlane  MRN:  [id number]  Principal Problem: GAD (generalized anxiety disorder) Discharge Diagnoses: Principal Problem:   GAD (generalized anxiety disorder) Active Problems:   Aggression  Clinical Impression:  Final diagnoses:  Suicidal ideation   Subjective: "Patient is under police custody police tell me that he reportedly was running in between cars trying to get struck. IVC paperwork was filled out by his mother. Police did not witness any of this activity."  On arrival patient was evaluated by Psychiatry provider after he was cleared by EDP.  Patient was started on Seroquel in am and at night time.  He was prescribed Hydroxyzine as needed for anxiety.  Patient has remains calm and cooperative.  No negative or agitation behavior noted.  He remains compliant with his Medications.  Patient reports mood is good, denies anxiety.  He admits to good sleep and appetite.  He receives outpatient care at [facility] and plans to continue receiving care from them.  Previous provider spoke with Patient's brother who says patient is stable to come home to him.  He want to be called to come pick up patient after discharge.  Patient denies SI/HI/AVH and no mention of paranoia.    Patient is alert and oriented x 5, walks with steady gait and denies headache.  Patient is Psychiatrically cleared.  ED Assessment Time Calculation: Start Time: 1054 Stop Time: 1130 Total Time in Minutes (Assessment Completion): 36   Past Psychiatric History: see initial evaluation note  Past Medical History:  Past Medical History:  Diagnosis Date   Panic attack    Polysubstance abuse (HCC)    Cocaine, BZD   SAH (subarachnoid hemorrhage) (HCC) 09/2022   Traumatic    Past Surgical History:  Procedure Laterality Date   CRANIOTOMY Left 12/06/2022   Procedure: CRANIOTOMY HEMATOMA EVACUATION SUBDURAL;  Surgeon: Bedelia Person, MD;  Location: [facility] OR;  Service:  Neurosurgery;  Laterality: Left;   Family History:  Family History  Problem Relation Age of Onset   Cancer Other    Family Psychiatric  History: see initial Psychiatry evaluation note Social History:  Social History   Substance and Sexual Activity  Alcohol Use Yes   Comment: occ     Social History   Substance and Sexual Activity  Drug Use Never    Social History   Socioeconomic History   Marital status: Legally Separated    Spouse name: Not on file   Number of children: Not on file   Years of education: Not on file   Highest education level: Not on file  Occupational History   Not on file  Tobacco Use   Smoking status: Every Day    Packs/day: .5    Types: Cigarettes   Smokeless tobacco: Never  Vaping Use   Vaping Use: Never used  Substance and Sexual Activity   Alcohol use: Yes    Comment: occ   Drug use: Never   Sexual activity: Yes    Birth control/protection: None  Other Topics Concern   Not on file  Social History Narrative   Not on file   Social Determinants of Health   Financial Resource Strain: Not on file  Food Insecurity: Not on file  Transportation Needs: Not on file  Physical Activity: Not on file  Stress: Not on file  Social Connections: Not on file    Tobacco Cessation:  N/A, patient does not currently use tobacco products  Current Medications: Current Facility-Administered Medications  Medication Dose Route Frequency Provider Last Rate Last Admin   hydrOXYzine (ATARAX) tablet 25 mg  25 mg Oral BID PRN Motley-Mangrum, Jadeka A, PMHNP   25 mg at 12/20/22 2148   ibuprofen (ADVIL) tablet 600 mg  600 mg Oral Once Tegeler, Canary Brim, MD       QUEtiapine (SEROQUEL) tablet 100 mg  100 mg Oral Daily Motley-Mangrum, Jadeka A, PMHNP   100 mg at 12/21/22 1118   QUEtiapine (SEROQUEL) tablet 200 mg  200 mg Oral QHS Motley-Mangrum, Jadeka A, PMHNP   200 mg at 12/20/22 2147   Current Outpatient Medications  Medication Sig Dispense Refill    hydrOXYzine (ATARAX) 25 MG tablet Take 1 tablet (25 mg total) by mouth 2 (two) times daily as needed for anxiety. 30 tablet 0   QUEtiapine (SEROQUEL) 100 MG tablet Take 1 tablet (100 mg total) by mouth daily. 30 tablet 0   QUEtiapine (SEROQUEL) 200 MG tablet Take 1 tablet (200 mg total) by mouth at bedtime. 30 tablet 0   PTA Medications: (Not in a hospital admission)   [place] Scale:  Flowsheet Row ED from 12/19/2022 in [facility] Emergency Department at [facility] ED from 09/19/2022 in [facility] Emergency Department at [facility] ED from 01/21/2022 in [facility] Emergency Department at [facility]  C-SSRS RISK CATEGORY No Risk No Risk No Risk       Musculoskeletal: Strength & Muscle Tone: within normal limits Gait & Station: normal Patient leans: Front  Psychiatric Specialty Exam: Presentation  General Appearance:  Casual; Neat  Eye Contact: Good  Speech: Clear and Coherent; Normal Rate  Speech Volume: Normal  Handedness: Right   Mood and Affect  Mood: Euthymic  Affect: Congruent   Thought Process  Thought Processes: Coherent; Goal Directed; Linear  Descriptions of Associations:Intact  Orientation:Full (Time, Place and Person)  Thought Content:Logical  History of Schizophrenia/Schizoaffective disorder:No data recorded Duration of Psychotic Symptoms:No data recorded Hallucinations:Hallucinations: None  Ideas of Reference:None  Suicidal Thoughts:Suicidal Thoughts: No  Homicidal Thoughts:Homicidal Thoughts: No   Sensorium  Memory: Immediate Good; Recent Good; Remote Good  Judgment: Intact  Insight: Present   Executive Functions  Concentration: Good  Attention Span: Good  Recall: Good  Fund of Knowledge: Good  Language: Good   Psychomotor Activity  Psychomotor Activity: Psychomotor Activity: Normal   Assets  Assets: Communication Skills; Desire for Improvement; Housing; Resilience   Sleep   Sleep: Sleep: Fair    Physical Exam: Physical Exam Vitals and nursing note reviewed.  Constitutional:      Appearance: Normal appearance.  HENT:     Head:     Comments: Left side scalp noted with sutures-s/p fall    Nose: Nose normal.  Cardiovascular:     Rate and Rhythm: Normal rate and regular rhythm.  Pulmonary:     Effort: Pulmonary effort is normal.  Musculoskeletal:        General: Normal range of motion.     Cervical back: Normal range of motion.  Skin:    General: Skin is warm and dry.  Neurological:     Mental Status: He is alert and oriented to person, place, and time.  Psychiatric:        Attention and Perception: Attention and perception normal.        Mood and Affect: Mood normal.        Speech: Speech normal.        Behavior: Behavior normal. Behavior is cooperative.  Thought Content: Thought content normal.        Cognition and Memory: Cognition and memory normal.        Judgment: Judgment normal.    Review of Systems  Constitutional: Negative.   HENT: Negative.    Eyes: Negative.   Respiratory: Negative.    Cardiovascular: Negative.   Gastrointestinal: Negative.   Genitourinary: Negative.   Musculoskeletal: Negative.   Skin: Negative.   Neurological: Negative.   Endo/Heme/Allergies: Negative.   Psychiatric/Behavioral:  The patient is nervous/anxious.    Blood pressure (!) 158/60, pulse 77, temperature 98.1 F (36.7 C), temperature source Oral, resp. rate 18, height 5\' 10"  (1.778 m), weight 70.9 kg, SpO2 100 %. Body mass index is 22.43 kg/m.   Demographic Factors:  Adolescent or young adult and Caucasian  Loss Factors: NA  Historical Factors: NA  Risk Reduction Factors:   Employed, Living with another person, especially a relative, and Positive therapeutic relationship  Continued Clinical Symptoms:  Panic Attacks Alcohol/Substance Abuse/Dependencies Previous Psychiatric Diagnoses and Treatments  Cognitive Features That  Contribute To Risk:  None    Suicide Risk:  Minimal: No identifiable suicidal ideation.  Patients presenting with no risk factors but with morbid ruminations; may be classified as minimal risk based on the severity of the depressive symptoms   Follow-up Information     Schedule an appointment as soon as possible for a visit with [facility].   Why: Please call to schedule an appointment for outpatient mental health services, Contact information: [address]  [address] [place] [address] [phone]         Schedule an appointment as soon as possible for a visit  with Bedelia Person, MD.   Specialty: Neurosurgery Why: As needed Contact information: [address] [place] [address] [phone]                 Plan Of Care/Follow-up recommendations:  Activity:  as tolerated Diet:  Regular  Medical Decision Making: Patient denies SI/HI/AVH.  He has very strong family support and he is compliant with Medications and treatment.  He follows up with [facility] for his Mental health care.  Patient is Psychiatrically cleared for discharge   Problem 1: Generalized anxiety disorder.  Disposition: Psychiatrically cleared. Earney Navy, NP-PMHNP-BC 12/21/2022, 11:42 AM

## 2023-01-29 ENCOUNTER — Encounter (HOSPITAL_COMMUNITY): Payer: Self-pay

## 2023-01-29 ENCOUNTER — Ambulatory Visit (INDEPENDENT_AMBULATORY_CARE_PROVIDER_SITE_OTHER): Payer: Medicaid Other | Admitting: Clinical

## 2023-01-29 DIAGNOSIS — F411 Generalized anxiety disorder: Secondary | ICD-10-CM | POA: Diagnosis not present

## 2023-01-29 DIAGNOSIS — F410 Panic disorder [episodic paroxysmal anxiety] without agoraphobia: Secondary | ICD-10-CM | POA: Diagnosis not present

## 2023-01-29 NOTE — Progress Notes (Signed)
Comprehensive Clinical Assessment (CCA) Note  01/29/2023 Author Slaven Jordahl [id number]  Chief Complaint:  Chief Complaint  Patient presents with   Panic Attack   Anxiety   Visit Diagnosis:   Generalized anxiety disorder with panic attacks   Interpretive Summary: Client is a 42 year old male presenting to the [facility] health center as a walk in for outpatient services. Client reported he is presenting by referral of [facility] for follow up.  Client presents with his fiance for the assessment.   Client reported he has a history of anxiety that has been reoccurring for at least 10 years. Client reported in July 2024 he was taken to the hospital but their dx of "suicidal ideation" inaccurate.  Client reported unfortunately he fell into his friend's pool while he was helping him with it.  Client reported when he came to he was told he had bleeding on the brain and had to go into an emergency surgery. Client believes it was misconstrued that he was trying to harm himself but that was not the case. Client described his symptoms of anxiety as a visible being seen as his whole body tensing up, unable to use his limbs, and difficulty articulating his words. Client reported he also has visible panic attacks and agitated related to the anxiety.  Client reported he has made changes of the limiting cigarette smoking along with limiting his sugar and caffeine intake as he notices it is a trigger for anxiety.  Client's fianc reported that he can be so intense they initially thought he was having a seizure.  Client's fianc also reported his agitation does not include aggression and/or property damage.  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  Client's fianc reported he has some difficulty with short-term memory loss but otherwise he seems to be doing okay following the surgery.  Client reported when he was younger he received services from [facility] outpatient but he has no other  history of inpatient treatment for mental health symptoms.  Client reported he has no history of suicidal ideations.  Client denied illicit substance use. Client presented to the appointment oriented x 5, appropriately dressed, and friendly.  Client denied hallucinations and delusions.  Client was screened for pain, nutrition, [place] suicide severity and the following S DOH:    01/29/2023    9:25 AM  GAD 7 : Generalized Anxiety Score  Nervous, Anxious, on Edge 3  Control/stop worrying 3  Worry too much - different things 3  Trouble relaxing 3  Restless 3  Easily annoyed or irritable 3  Afraid - awful might happen 3  Total GAD 7 Score 21  Anxiety Difficulty Extremely difficult   Treatment recommendations: Psychiatry for medication management.  Client declined individual counseling at this time Therapist provided information on format of appointment (virtual or face to face).   The client was advised to call back or seek an in-person evaluation if the symptoms worsen or if the condition fails to improve as anticipated before the next scheduled appointment. Client was in agreement with treatment recommendations.    CCA Biopsychosocial Intake/Chief Complaint:  client is presenting as a walk in for the reoccuring symptoms of anxiety and panic attacks. client reported his symptms have been ongoing for 10 years.  Current Symptoms/Problems: client reported hyperventilation, feeling on edge, pressured speech  Patient Reported Schizophrenia/Schizoaffective Diagnosis in Past: No  Strengths: has positive family support  Preferences: psychiatry and medication management  Abilities: able to be vocal and problems  and needs  Type of Services Patient Feels are Needed: medication management  Initial Clinical Notes/Concerns: No data recorded  Mental Health Symptoms Depression:   None   Duration of Depressive symptoms: No data recorded  Mania:   None   Anxiety:    Difficulty  concentrating; Restlessness; Sleep; Tension; Worrying   Psychosis:   None   Duration of Psychotic symptoms: No data recorded  Trauma:   None   Obsessions:   None   Compulsions:   None   Inattention:   None   Hyperactivity/Impulsivity:   None   Oppositional/Defiant Behaviors:   None   Emotional Irregularity:   None   Other Mood/Personality Symptoms:  No data recorded   Mental Status Exam Appearance and self-care  Stature:   Average   Weight:   Average weight   Clothing:   Casual   Grooming:   Normal   Cosmetic use:   Age appropriate   Posture/gait:   Normal   Motor activity:   Not Remarkable   Sensorium  Attention:   Normal   Concentration:   Normal   Orientation:   X5   Recall/memory:   Normal   Affect and Mood  Affect:   Congruent   Mood:   Anxious   Relating  Eye contact:   None   Facial expression:   Responsive   Attitude toward examiner:   Cooperative   Thought and Language  Speech flow:  Clear and Coherent   Thought content:   Appropriate to Mood and Circumstances   Preoccupation:   None   Hallucinations:   None   Organization:  No data recorded  [employer] of Knowledge:   Good   Intelligence:   Average   Abstraction:   Normal   Judgement:   Good   Reality Testing:   Adequate   Insight:   Good   Decision Making:   Normal   Social Functioning  Social Maturity:  No data recorded  Social Judgement:  No data recorded  Stress  Stressors:  No data recorded  Coping Ability:  No data recorded  Skill Deficits:  No data recorded  Supports:  No data recorded    Religion:    Leisure/Recreation:    Exercise/Diet:     CCA Employment/Education Employment/Work Situation: Employment / Work Situation Employment Situation: On disability How Long has Patient Been on Disability: client reported he was on disability but unsure of why they stopped it when he was 73. client  reported he did recieve it for mental health reasons.  Education: Education Did Garment/textile technologist From [employer]?: No (client reported he last completed 8th grade.) Did You Attend College?: No Did Designer, television/film set?: No   CCA Family/Childhood History Family and Relationship History: Family history Marital status: Long term relationship Long term relationship, how long?: engaged for 8 years Does patient have children?: Yes How many children?: 1  Childhood History:  Childhood History Additional childhood history information: client reported he was born in [place]. client reported he was raised by his mother. client reported his father left during childhood but he loved him. client reported 4 years ago passed away. client reporte dhe forgave his dad for not being in his life. Patient's description of current relationship with people who raised him/her: client reported his mother is alive and they have a great relationship. Does patient have siblings?: Yes Number of Siblings: 3 Description of patient's current relationship with siblings: client reported he has  one sister and 2 brothers. client reported they have a good relationship. Did patient suffer any verbal/emotional/physical/sexual abuse as a child?: No Did patient suffer from severe childhood neglect?: No Has patient ever been sexually abused/assaulted/raped as an adolescent or adult?: No Was the patient ever a victim of a crime or a disaster?: No Witnessed domestic violence?: No Has patient been affected by domestic violence as an adult?: No  Child/Adolescent Assessment:     CCA Substance Use Alcohol/Drug Use: Alcohol / Drug Use History of alcohol / drug use?: No history of alcohol / drug abuse                         ASAM's:  Six Dimensions of Multidimensional Assessment  Dimension 1:  Acute Intoxication and/or Withdrawal Potential:      Dimension 2:  Biomedical Conditions and Complications:       Dimension 3:  Emotional, Behavioral, or Cognitive Conditions and Complications:     Dimension 4:  Readiness to Change:     Dimension 5:  Relapse, Continued use, or Continued Problem Potential:     Dimension 6:  Recovery/Living Environment:     ASAM Severity Score:    ASAM Recommended Level of Treatment:     Substance use Disorder (SUD)    Recommendations for Services/Supports/Treatments: Recommendations for Services/Supports/Treatments Recommendations For Services/Supports/Treatments: Medication Management  DSM5 Diagnoses: Patient Active Problem List   Diagnosis Date Noted   Aggression 12/19/2022   GAD (generalized anxiety disorder) 12/19/2022   Malnutrition of moderate degree 12/16/2022   Cocaine intoxication delirium (HCC) 12/16/2022   Subdural hematoma (HCC) 12/06/2022   SDH (subdural hematoma) (HCC) 12/06/2022    Patient Centered Plan: Patient is on the following Treatment Plan(s):  Anxiety   Referrals to Alternative Service(s): Referred to Alternative Service(s):   Place:   Date:   Time:    Referred to Alternative Service(s):   Place:   Date:   Time:    Referred to Alternative Service(s):   Place:   Date:   Time:    Referred to Alternative Service(s):   Place:   Date:   Time:      Collaboration of Care: Medication Management [facility] psychiatry  Patient/Guardian was advised Release of Information must be obtained prior to any record release in order to collaborate their care with an outside provider. Patient/Guardian was advised if they have not already done so to contact the registration department to sign all necessary forms in order for [place] to release information regarding their care.   Consent: Patient/Guardian gives verbal consent for treatment and assignment of benefits for services provided during this visit. Patient/Guardian expressed understanding and agreed to proceed.   Neena Rhymes Daquana Paddock, LCSW

## 2023-01-30 ENCOUNTER — Encounter (HOSPITAL_COMMUNITY): Payer: Self-pay | Admitting: Physician Assistant

## 2023-01-30 ENCOUNTER — Ambulatory Visit (INDEPENDENT_AMBULATORY_CARE_PROVIDER_SITE_OTHER): Payer: Medicaid Other | Admitting: Physician Assistant

## 2023-01-30 VITALS — BP 136/101 | HR 76 | Temp 98.0°F | Ht 68.0 in | Wt 185.0 lb

## 2023-01-30 DIAGNOSIS — F411 Generalized anxiety disorder: Secondary | ICD-10-CM

## 2023-01-30 DIAGNOSIS — F410 Panic disorder [episodic paroxysmal anxiety] without agoraphobia: Secondary | ICD-10-CM

## 2023-01-30 DIAGNOSIS — F3161 Bipolar disorder, current episode mixed, mild: Secondary | ICD-10-CM | POA: Diagnosis not present

## 2023-01-30 MED ORDER — QUETIAPINE FUMARATE 200 MG PO TABS
200.0000 mg | ORAL_TABLET | Freq: Every day | ORAL | 1 refills | Status: DC
Start: 2023-01-30 — End: 2023-03-13

## 2023-01-30 MED ORDER — QUETIAPINE FUMARATE 100 MG PO TABS
100.0000 mg | ORAL_TABLET | Freq: Every day | ORAL | 1 refills | Status: DC
Start: 2023-01-30 — End: 2023-03-13

## 2023-01-30 MED ORDER — ALPRAZOLAM 0.5 MG PO TABS
0.5000 mg | ORAL_TABLET | Freq: Two times a day (BID) | ORAL | 0 refills | Status: DC | PRN
Start: 2023-01-30 — End: 2023-03-13

## 2023-01-30 NOTE — Progress Notes (Signed)
Psychiatric Initial Adult Assessment   Patient Identification: Joshua Bishop MRN:  [id number] Date of Evaluation:  01/30/2023 Referral Source: Referred by  Chief Complaint:   Chief Complaint  Patient presents with   Establish Care   Medication Management   Visit Diagnosis:    ICD-10-CM   1. Generalized anxiety disorder with panic attacks  F41.1 ALPRAZolam (XANAX) 0.5 MG tablet   F41.0     2. Bipolar disorder, current episode mixed, mild (HCC)  F31.61 QUEtiapine (SEROQUEL) 200 MG tablet    QUEtiapine (SEROQUEL) 100 MG tablet      History of Present Illness:    Joshua Bishop is a 42 year old male with a past psychiatric history significant for generalized anxiety disorder with panic attacks and bipolar disorder who presents to [facility], accompanied by his fiance Morrie Sheldon [place], [phone]), to establish psychiatric care and for medication management.  Patient presents to this encounter with a chief complaint of ongoing panic attacks and to be placed back on his medications.  Patient reports that he has a history of panic attacks stating that they are severe and occur almost every day.  Patient reports that he was prescribed Xanax by his primary care provider until he was able to be set up with a psychiatrist.  Patient reports that his panic attacks are so severe that he has a difficult time leaving the house.  Patient reports that he has been dealing with panic attacks for roughly 10 years.  He states that his panic attacks are triggered by eating sweets, drinking energy drinks, and heat.  Patient's panic attacks are characterized by the following symptoms: arm and mouth numbness, difficulty breathing, hands locking up, and body shakes.   In his head, patient reports that he feels that his panic attacks are occurring for longer than they appear.  Patient states that he often feels like he is dying when having a panic attack.  During  his panic attacks, patient reports becoming easily agitated.  Patient has a history of multiple ED visits due to his panic attacks.  Patient endorses anxiety and rates his anxiety as 10 out of 10.  Stressors related to patient's anxiety include being unable to work and his disdain towards politics.  Patient denies depressive symptoms at this time stating that his symptoms appear to be well manage using quetiapine.  Patient is currently taking the following psychiatric medications:  Xanax 0.5 mg 2 times daily as needed Seroquel (quetiapine) 100 mg daily Seroquel (quetiapine) 200 mg at bedtime  Patient denies a past history of hospitalization due to mental health.  He further denies a past history of suicide attempts nor does he endorse a past history of homicide attempts.  A PHQ-9 screen was performed with the patient scoring at 13.  A GAD-7 screen was also performed with the patient scoring a 19.  Patient is alert and oriented x 4, calm, cooperative, and fully engaged in conversation during the encounter.  Patient describes his mood as fine.  Patient denies suicidal or homicidal ideations.  He further denies auditory or visual hallucinations and does not appear to be responding to internal/external stimuli.  Patient denies paranoia or delusional thoughts at this time.  Patient endorses good sleep and receives on average 12 hours of sleep per day.  Patient endorses good appetite and eats on average 3 meals per day.  Patient denied alcohol consumption.  Patient further denies tobacco use and states that he last had a cigarette 2 months  ago.  Patient denies illicit drug use.  Associated Signs/Symptoms: Depression Symptoms:  psychomotor agitation, psychomotor retardation, fatigue, difficulty concentrating, impaired memory, anxiety, panic attacks, loss of energy/fatigue, (Hypo) Manic Symptoms:  Distractibility, Elevated Mood, Flight of Ideas, Grandiosity, Anxiety Symptoms:  Agoraphobia, Excessive  Worry, Panic Symptoms, Social Anxiety, Psychotic Symptoms:  Paranoia, PTSD Symptoms: Negative. Patient reports that he has a history of brain surgery.  Past Psychiatric History:  As per patient, patient has a past psychiatric history significant for bipolar disorder.  Patient also has a past psychiatric history of generalized anxiety disorder with panic attacks.  Patient denies a past history of hospitalization due to mental health.  Patient denies a past history of suicide attempt  Patient denies a past history of homicide attempts  Previous Psychotropic Medications: Yes   Substance Abuse History in the last 12 months:  No.  Consequences of Substance Abuse: Negative  Past Medical History:  Past Medical History:  Diagnosis Date   Panic attack    Polysubstance abuse (HCC)    Cocaine, BZD   SAH (subarachnoid hemorrhage) (HCC) 09/2022   Traumatic    Past Surgical History:  Procedure Laterality Date   CRANIOTOMY Left 12/06/2022   Procedure: CRANIOTOMY HEMATOMA EVACUATION SUBDURAL;  Surgeon: Bedelia Person, MD;  Location: [facility] OR;  Service: Neurosurgery;  Laterality: Left;    Family Psychiatric History:  Mother - Anxiety Brothers - Anxiety Uncle - Anxiety  Family history of suicide attempts: Patient denies Family history homicide attempts: Patient denies Family history of substance abuse: Patient denies  Family History:  Family History  Problem Relation Age of Onset   Cancer Other     Social History:   Social History   Socioeconomic History   Marital status: Legally Separated    Spouse name: Not on file   Number of children: Not on file   Years of education: Not on file   Highest education level: Not on file  Occupational History   Not on file  Tobacco Use   Smoking status: Every Day    Current packs/day: 0.50    Types: Cigarettes   Smokeless tobacco: Never  Vaping Use   Vaping status: Never Used  Substance and Sexual Activity   Alcohol use: Yes     Comment: occ   Drug use: Never   Sexual activity: Yes    Birth control/protection: None  Other Topics Concern   Not on file  Social History Narrative   Not on file   Social Determinants of Health   Financial Resource Strain: Not on file  Food Insecurity: Not on file  Transportation Needs: Not on file  Physical Activity: Not on file  Stress: Not on file  Social Connections: Not on file    Additional Social History:  Patient endorses social support.  Patient endorses having children of his own.  Patient endorses helping.  Patient denies being employed at this time.  Patient denies past history of military experience.  Patient endorses a past history of jail time. The highest education earned by the patient was 8th grade.  Patient denies access to weapons.  Allergies:   Allergies  Allergen Reactions   Zidovudine Other (See Comments)    `unknown     Metabolic Disorder Labs: Lab Results  Component Value Date   HGBA1C 5.1 12/06/2022   MPG 99.67 12/06/2022   No results found for: "PROLACTIN" Lab Results  Component Value Date   TRIG 77 12/07/2022   No results found for: "TSH"  Therapeutic Level  Labs: No results found for: "LITHIUM" No results found for: "CBMZ" No results found for: "VALPROATE"  Current Medications: Current Outpatient Medications  Medication Sig Dispense Refill   ALPRAZolam (XANAX) 0.5 MG tablet Take 1 tablet (0.5 mg total) by mouth 2 (two) times daily as needed for anxiety. 60 tablet 0   hydrOXYzine (ATARAX) 25 MG tablet Take 1 tablet (25 mg total) by mouth 2 (two) times daily as needed for anxiety. 30 tablet 0   QUEtiapine (SEROQUEL) 100 MG tablet Take 1 tablet (100 mg total) by mouth daily. 30 tablet 1   QUEtiapine (SEROQUEL) 200 MG tablet Take 1 tablet (200 mg total) by mouth at bedtime. 30 tablet 1   No current facility-administered medications for this visit.    Musculoskeletal: Strength & Muscle Tone: within normal limits Gait & Station:  normal Patient leans: N/A  Psychiatric Specialty Exam: Review of Systems  Psychiatric/Behavioral:  Negative for decreased concentration, dysphoric mood, hallucinations, self-injury, sleep disturbance and suicidal ideas. The patient is nervous/anxious. The patient is not hyperactive.     Blood pressure (!) 136/101, pulse 76, temperature 98 F (36.7 C), temperature source Oral, height 5\' 8"  (1.727 m), weight 185 lb (83.9 kg), SpO2 97%.Body mass index is 28.13 kg/m.  General Appearance: Casual  Eye Contact:  Good  Speech:  Clear and Coherent and Normal Rate  Volume:  Normal  Mood:  Anxious and Depressed  Affect:  Congruent  Thought Process:  Coherent and Descriptions of Associations: Intact  Orientation:  Full (Time, Place, and Person)  Thought Content:  WDL  Suicidal Thoughts:  No  Homicidal Thoughts:  No  Memory:  Immediate;   Fair Recent;   Fair Remote;   Fair  Judgement:  Fair  Insight:  Fair  Psychomotor Activity:  Normal  Concentration:  Concentration: Good and Attention Span: Good  Recall:  Good  Fund of Knowledge:Good  Language: Good  Akathisia:  No  Handed:  Right  AIMS (if indicated):  not done  Assets:  Communication Skills Desire for Improvement Housing Intimacy Physical Health Social Support Transportation  ADL's:  Intact  Cognition: WNL  Sleep:  Good   Screenings: GAD-7    Flowsheet Row Office Visit from 01/30/2023 in [facility] Counselor from 01/29/2023 in [facility]  Total GAD-7 Score 19 21      PHQ2-9    Flowsheet Row Office Visit from 01/30/2023 in [facility]  PHQ-2 Total Score 4  PHQ-9 Total Score 13      Flowsheet Row Office Visit from 01/30/2023 in [facility] Counselor from 01/29/2023 in [facility] ED from 12/19/2022 in [facility] Department at [facility]  C-SSRS RISK CATEGORY  No Risk No Risk No Risk       Assessment and Plan:   Joshua Bishop is a 42 year old male with a past psychiatric history significant for generalized anxiety disorder with panic attacks and bipolar disorder who presents to [facility], accompanied by his fiance Morrie Sheldon [place], [phone]), to establish psychiatric care and for medication management.  Patient presents to the encounter dealing with ongoing anxiety accompanied by panic attacks.  Patient reports that he has been dealing with panic attacks for roughly 10 years.  When experiencing a panic attack, patient states that he often seizes up.  Patient was previously being prescribed Xanax by his primary care provider due to the severity of his panic  attacks.  He reports that he was last given a 30-day supply of Xanax, but has since run out of the medication.  In addition to taking Xanax, patient endorses the use of quetiapine 100 mg daily and 200 mg at bedtime.  Although patient states that quetiapine is able to keep him calm, he reports that the medication does not prevent the occurrence of panic attacks.  Patient has a history of multiple ED visits due to his anxiety accompanied by panic attacks.  Provider to place patient back on Xanax 0.5 mg 2 times daily as needed; however, provider explained to patient that the medication would need to be slowly tapered off in the near future due to the medication causing potential side effects due to long-term use.  Patient vocalized understanding.  Patient to be placed back on quetiapine 100 mg daily and 200 mg at bedtime for mood stability.  Patient was agreeable to recommendation.  Patient's medications to be e-prescribed to pharmacy of choice.  Provider allowed for time at the end of the encounter to discuss potential side effects to patient's current medication regimen.  Patient vocalized understanding.  Collaboration of Care: Medication Management AEB  provider managing patient's psychiatric medications, Primary Care Provider AEB patient being followed by primary care provider, and Psychiatrist AEB patient being followed by mental health provider at this facility  Patient/Guardian was advised Release of Information must be obtained prior to any record release in order to collaborate their care with an outside provider. Patient/Guardian was advised if they have not already done so to contact the registration department to sign all necessary forms in order for [place] to release information regarding their care.   Consent: Patient/Guardian gives verbal consent for treatment and assignment of benefits for services provided during this visit. Patient/Guardian expressed understanding and agreed to proceed.   1. Generalized anxiety disorder with panic attacks  - ALPRAZolam (XANAX) 0.5 MG tablet; Take 1 tablet (0.5 mg total) by mouth 2 (two) times daily as needed for anxiety.  Dispense: 60 tablet; Refill: 0  2. Bipolar disorder, current episode mixed, mild (HCC)  - QUEtiapine (SEROQUEL) 200 MG tablet; Take 1 tablet (200 mg total) by mouth at bedtime.  Dispense: 30 tablet; Refill: 1 - QUEtiapine (SEROQUEL) 100 MG tablet; Take 1 tablet (100 mg total) by mouth daily.  Dispense: 30 tablet; Refill: 1   Patient to follow up in 6 weeks Provider spent a total of 50 minutes with the patient/reviewing patient's chart  Meta Hatchet, PA 8/15/202411:16 AM

## 2023-03-03 ENCOUNTER — Other Ambulatory Visit: Payer: Self-pay | Admitting: Neurosurgery

## 2023-03-03 DIAGNOSIS — S065XAA Traumatic subdural hemorrhage with loss of consciousness status unknown, initial encounter: Secondary | ICD-10-CM

## 2023-03-04 ENCOUNTER — Encounter: Payer: Self-pay | Admitting: Neurosurgery

## 2023-03-13 ENCOUNTER — Ambulatory Visit (INDEPENDENT_AMBULATORY_CARE_PROVIDER_SITE_OTHER): Payer: Medicaid Other | Admitting: Physician Assistant

## 2023-03-13 DIAGNOSIS — F3161 Bipolar disorder, current episode mixed, mild: Secondary | ICD-10-CM

## 2023-03-13 DIAGNOSIS — F410 Panic disorder [episodic paroxysmal anxiety] without agoraphobia: Secondary | ICD-10-CM

## 2023-03-13 DIAGNOSIS — F411 Generalized anxiety disorder: Secondary | ICD-10-CM | POA: Diagnosis not present

## 2023-03-13 MED ORDER — QUETIAPINE FUMARATE 100 MG PO TABS
100.0000 mg | ORAL_TABLET | Freq: Every day | ORAL | 1 refills | Status: AC
Start: 2023-03-13 — End: 2024-03-12

## 2023-03-13 MED ORDER — ALPRAZOLAM 0.25 MG PO TABS
0.2500 mg | ORAL_TABLET | Freq: Two times a day (BID) | ORAL | 0 refills | Status: AC | PRN
Start: 2023-03-13 — End: ?

## 2023-03-13 MED ORDER — QUETIAPINE FUMARATE 200 MG PO TABS
200.0000 mg | ORAL_TABLET | Freq: Every day | ORAL | 1 refills | Status: AC
Start: 2023-03-13 — End: 2024-03-12

## 2023-03-13 NOTE — Progress Notes (Unsigned)
BH MD/PA/NP OP Progress Note  03/14/2023 5:57 PM Joshua Bishop  MRN:  [id number]  Chief Complaint:  Chief Complaint  Patient presents with   Follow-up   Medication Refill   HPI: ***  Joshua Bishop  Visit Diagnosis:    ICD-10-CM   1. Bipolar disorder, current episode mixed, mild (HCC)  F31.61 QUEtiapine (SEROQUEL) 200 MG tablet    QUEtiapine (SEROQUEL) 100 MG tablet    2. Generalized anxiety disorder with panic attacks  F41.1 ALPRAZolam (XANAX) 0.25 MG tablet   F41.0       Past Psychiatric History: ***  Past Medical History:  Past Medical History:  Diagnosis Date   Panic attack    Polysubstance abuse (HCC)    Cocaine, BZD   SAH (subarachnoid hemorrhage) (HCC) 09/2022   Traumatic    Past Surgical History:  Procedure Laterality Date   CRANIOTOMY Left 12/06/2022   Procedure: CRANIOTOMY HEMATOMA EVACUATION SUBDURAL;  Surgeon: Bedelia Person, MD;  Location: [facility] OR;  Service: Neurosurgery;  Laterality: Left;    Family Psychiatric History: ***  Family History:  Family History  Problem Relation Age of Onset   Cancer Other     Social History:  Social History   Socioeconomic History   Marital status: Legally Separated    Spouse name: Not on file   Number of children: Not on file   Years of education: Not on file   Highest education level: Not on file  Occupational History   Not on file  Tobacco Use   Smoking status: Every Day    Current packs/day: 0.50    Types: Cigarettes   Smokeless tobacco: Never  Vaping Use   Vaping status: Never Used  Substance and Sexual Activity   Alcohol use: Yes    Comment: occ   Drug use: Never   Sexual activity: Yes    Birth control/protection: None  Other Topics Concern   Not on file  Social History Narrative   Not on file   Social Determinants of Health   Financial Resource Strain: Not on file  Food Insecurity: Not on file  Transportation Needs: Not on file  Physical Activity: Not on file  Stress: Not  on file  Social Connections: Not on file    Allergies:  Allergies  Allergen Reactions   Zidovudine Other (See Comments)    `unknown     Metabolic Disorder Labs: Lab Results  Component Value Date   HGBA1C 5.1 12/06/2022   MPG 99.67 12/06/2022   No results found for: "PROLACTIN" Lab Results  Component Value Date   TRIG 77 12/07/2022   No results found for: "TSH"  Therapeutic Level Labs: No results found for: "LITHIUM" No results found for: "VALPROATE" No results found for: "CBMZ"  Current Medications: Current Outpatient Medications  Medication Sig Dispense Refill   ALPRAZolam (XANAX) 0.25 MG tablet Take 1 tablet (0.25 mg total) by mouth 2 (two) times daily as needed for anxiety. 60 tablet 0   hydrOXYzine (ATARAX) 25 MG tablet Take 1 tablet (25 mg total) by mouth 2 (two) times daily as needed for anxiety. 30 tablet 0   QUEtiapine (SEROQUEL) 100 MG tablet Take 1 tablet (100 mg total) by mouth daily. 30 tablet 1   QUEtiapine (SEROQUEL) 200 MG tablet Take 1 tablet (200 mg total) by mouth at bedtime. 30 tablet 1   No current facility-administered medications for this visit.     Musculoskeletal: Strength & Muscle Tone: {desc; muscle tone:32375} Gait & Station: {PE GAIT  ED YNWG:95621} Patient leans: {Patient Leans:21022755}  Psychiatric Specialty Exam: Review of Systems  There were no vitals taken for this visit.There is no height or weight on file to calculate BMI.  General Appearance: {Appearance:22683}  Eye Contact:  {BHH EYE CONTACT:22684}  Speech:  {Speech:22685}  Volume:  {Volume (PAA):22686}  Mood:  {BHH MOOD:22306}  Affect:  {Affect (PAA):22687}  Thought Process:  {Thought Process (PAA):22688}  Orientation:  {BHH ORIENTATION (PAA):22689}  Thought Content: {Thought Content:22690}   Suicidal Thoughts:  {ST/HT (PAA):22692}  Homicidal Thoughts:  {ST/HT (PAA):22692}  Memory:  {BHH MEMORY:22881}  Judgement:  {Judgement (PAA):22694}  Insight:  {Insight  (PAA):22695}  Psychomotor Activity:  {Psychomotor (PAA):22696}  Concentration:  {Concentration:21399}  Recall:  {BHH GOOD/FAIR/POOR:22877}  Fund of Knowledge: {BHH GOOD/FAIR/POOR:22877}  Language: {BHH GOOD/FAIR/POOR:22877}  Akathisia:  {BHH YES OR NO:22294}  Handed:  {Handed:22697}  AIMS (if indicated): {Desc; done/not:10129}  Assets:  {Assets (PAA):22698}  ADL's:  {BHH HYQ'M:57846}  Cognition: {chl bhh cognition:304700322}  Sleep:  {BHH GOOD/FAIR/POOR:22877}   Screenings: GAD-7    Flowsheet Row Clinical Support from 03/13/2023 in [facility] Office Visit from 01/30/2023 in [facility] Counselor from 01/29/2023 in [facility]  Total GAD-7 Score 2 19 21       PHQ2-9    Flowsheet Row Clinical Support from 03/13/2023 in [facility] Office Visit from 01/30/2023 in [facility]  PHQ-2 Total Score 0 4  PHQ-9 Total Score -- 13      Flowsheet Row Clinical Support from 03/13/2023 in [facility] Office Visit from 01/30/2023 in [facility] Counselor from 01/29/2023 in [facility]  C-SSRS RISK CATEGORY No Risk No Risk No Risk        Assessment and Plan: ***  Collaboration of Care: Collaboration of Care: [facility] OP Collaboration of Care:[id number]}  Patient/Guardian was advised Release of Information must be obtained prior to any record release in order to collaborate their care with an outside provider. Patient/Guardian was advised if they have not already done so to contact the registration department to sign all necessary forms in order for [place] to release information regarding their care.   Consent: Patient/Guardian gives verbal consent for treatment and assignment of benefits for services provided during this visit. Patient/Guardian expressed understanding and agreed to  proceed.    Meta Hatchet, PA 03/14/2023, 5:57 PM

## 2023-03-17 ENCOUNTER — Ambulatory Visit
Admission: RE | Admit: 2023-03-17 | Discharge: 2023-03-17 | Disposition: A | Discharge status: Home / Self Care | Payer: Medicaid Other | Source: Ambulatory Visit | Attending: Neurosurgery

## 2023-03-17 DIAGNOSIS — S065XAA Traumatic subdural hemorrhage with loss of consciousness status unknown, initial encounter: Secondary | ICD-10-CM

## 2023-05-08 ENCOUNTER — Encounter (HOSPITAL_COMMUNITY): Payer: Medicaid Other | Admitting: Physician Assistant

## 2023-09-07 ENCOUNTER — Other Ambulatory Visit (HOSPITAL_COMMUNITY): Payer: Self-pay | Admitting: Physician Assistant

## 2023-09-07 DIAGNOSIS — F3161 Bipolar disorder, current episode mixed, mild: Secondary | ICD-10-CM

## 2023-10-05 ENCOUNTER — Other Ambulatory Visit (HOSPITAL_COMMUNITY): Payer: Self-pay | Admitting: Physician Assistant

## 2023-10-05 DIAGNOSIS — F3161 Bipolar disorder, current episode mixed, mild: Secondary | ICD-10-CM

## 2023-10-11 ENCOUNTER — Other Ambulatory Visit (HOSPITAL_COMMUNITY): Payer: Self-pay | Admitting: Physician Assistant

## 2023-10-11 DIAGNOSIS — F3161 Bipolar disorder, current episode mixed, mild: Secondary | ICD-10-CM

## 2023-11-16 ENCOUNTER — Other Ambulatory Visit (HOSPITAL_COMMUNITY): Payer: Self-pay | Admitting: Physician Assistant

## 2023-11-16 DIAGNOSIS — F3161 Bipolar disorder, current episode mixed, mild: Secondary | ICD-10-CM

## 2023-12-17 ENCOUNTER — Other Ambulatory Visit (HOSPITAL_COMMUNITY): Payer: Self-pay | Admitting: Physician Assistant

## 2023-12-17 DIAGNOSIS — F3161 Bipolar disorder, current episode mixed, mild: Secondary | ICD-10-CM

## 2024-01-16 ENCOUNTER — Other Ambulatory Visit (HOSPITAL_COMMUNITY): Payer: Self-pay | Admitting: Physician Assistant

## 2024-01-16 DIAGNOSIS — F3161 Bipolar disorder, current episode mixed, mild: Secondary | ICD-10-CM
